# Patient Record
Sex: Male | Born: 1970 | Race: Black or African American | Hispanic: No | Marital: Married | Smoking: Never smoker
Health system: Southern US, Community
[De-identification: ages and names within clinical notes are randomized; demographics above are authoritative.]

## PROBLEM LIST (undated history)

## (undated) DIAGNOSIS — I10 Essential (primary) hypertension: Secondary | ICD-10-CM

## (undated) HISTORY — PX: KNEE SURGERY: SHX244

---

## 2002-11-12 ENCOUNTER — Emergency Department (HOSPITAL_COMMUNITY): Admission: EM | Admit: 2002-11-12 | Discharge: 2002-11-12 | Payer: Self-pay | Admitting: Emergency Medicine

## 2002-12-17 ENCOUNTER — Emergency Department (HOSPITAL_COMMUNITY): Admission: EM | Admit: 2002-12-17 | Discharge: 2002-12-18 | Payer: Self-pay | Admitting: Emergency Medicine

## 2004-04-16 ENCOUNTER — Emergency Department (HOSPITAL_COMMUNITY): Admission: EM | Admit: 2004-04-16 | Discharge: 2004-04-16 | Payer: Self-pay | Admitting: Emergency Medicine

## 2005-06-20 ENCOUNTER — Emergency Department (HOSPITAL_COMMUNITY): Admission: EM | Admit: 2005-06-20 | Discharge: 2005-06-20 | Payer: Self-pay | Admitting: Emergency Medicine

## 2005-08-06 ENCOUNTER — Emergency Department (HOSPITAL_COMMUNITY): Admission: EM | Admit: 2005-08-06 | Discharge: 2005-08-06 | Payer: Self-pay | Admitting: Emergency Medicine

## 2009-07-06 ENCOUNTER — Emergency Department (HOSPITAL_COMMUNITY): Admission: EM | Admit: 2009-07-06 | Discharge: 2009-07-06 | Payer: Self-pay | Admitting: Emergency Medicine

## 2011-01-01 ENCOUNTER — Emergency Department (HOSPITAL_COMMUNITY)
Admission: EM | Admit: 2011-01-01 | Discharge: 2011-01-01 | Payer: Self-pay | Source: Home / Self Care | Admitting: Emergency Medicine

## 2011-03-24 LAB — BASIC METABOLIC PANEL
BUN: 17 mg/dL (ref 6–23)
Chloride: 106 mEq/L (ref 96–112)
Creatinine, Ser: 1.34 mg/dL (ref 0.4–1.5)
GFR calc Af Amer: >60 mL/min (ref >60)
Glucose, Bld: 103 mg/dL — ABNORMAL HIGH (ref 70–99)
Potassium: 4.1 mEq/L (ref 3.5–5.1)

## 2011-03-24 LAB — DIFFERENTIAL
Basophils Absolute: 0.1 10*3/uL (ref 0.0–0.1)
Eosinophils Absolute: 0.2 10*3/uL (ref 0.0–0.7)
Eosinophils Relative: 2 % (ref 0–5)
Lymphocytes Relative: 27 % (ref 12–46)
Monocytes Absolute: 0.6 10*3/uL (ref 0.1–1.0)
Monocytes Relative: 8 % (ref 3–12)
Neutrophils Relative %: 62 % (ref 43–77)

## 2011-03-24 LAB — CBC
HCT: 43.5 % (ref 39.0–52.0)
Hemoglobin: 15.1 g/dL (ref 13.0–17.0)
MCV: 89.2 fL (ref 78.0–100.0)
Platelets: 257 10*3/uL (ref 150–400)
RDW: 12.6 % (ref 11.5–15.5)

## 2011-03-24 LAB — POCT CARDIAC MARKERS: Myoglobin, poc: 48.2 ng/mL (ref 12–200)

## 2014-04-28 ENCOUNTER — Encounter (HOSPITAL_COMMUNITY): Payer: Self-pay | Admitting: Emergency Medicine

## 2014-04-28 ENCOUNTER — Emergency Department (INDEPENDENT_AMBULATORY_CARE_PROVIDER_SITE_OTHER)
Admission: EM | Admit: 2014-04-28 | Discharge: 2014-04-28 | Disposition: A | Discharge status: Home / Self Care | Payer: BC Managed Care – PPO | Source: Home / Self Care | Attending: Family Medicine | Admitting: Family Medicine

## 2014-04-28 DIAGNOSIS — I10 Essential (primary) hypertension: Secondary | ICD-10-CM

## 2014-04-28 LAB — POCT I-STAT, CHEM 8
BUN: 13 mg/dL (ref 6–23)
CALCIUM ION: 1.22 mmol/L (ref 1.12–1.23)
CREATININE: 1.4 mg/dL — AB (ref 0.50–1.35)
Chloride: 102 mEq/L (ref 96–112)
Glucose, Bld: 101 mg/dL — ABNORMAL HIGH (ref 70–99)
HEMATOCRIT: 46 % (ref 39.0–52.0)
HEMOGLOBIN: 15.6 g/dL (ref 13.0–17.0)
Potassium: 4.1 mEq/L (ref 3.7–5.3)
SODIUM: 140 meq/L (ref 137–147)
TCO2: 28 mmol/L (ref 0–100)

## 2014-04-28 MED ORDER — LISINOPRIL 10 MG PO TABS: 10.0000 mg | ORAL_TABLET | Freq: Every day | ORAL | Status: DC

## 2014-04-28 MED ORDER — AMLODIPINE BESYLATE 10 MG PO TABS: 10.0000 mg | ORAL_TABLET | Freq: Every day | ORAL | Status: DC

## 2014-04-28 NOTE — ED Provider Notes (Signed)
CSN: [id number]     Arrival date & time 04/28/14  1600 History   First MD Initiated Contact with Patient 04/28/14 1630     Chief Complaint  Patient presents with  . Hypertension   (Consider location/radiation/quality/duration/timing/severity/associated sxs/prior Treatment) Patient is a 43 y.o. male presenting with hypertension. The history is provided by the patient.  Hypertension This is a new problem. The current episode started 3 to 5 hours ago (told by orthopedist today that bp 180/110 and [facility]. no prior h/o hbp., no smoking.). Pertinent negatives include no headaches and no shortness of breath.    History reviewed. No pertinent past medical history. History reviewed. No pertinent past surgical history. Family History  Problem Relation Age of Onset  . Hypertension Mother   . Hypertension Other    History  Substance Use Topics  . Smoking status: Never Smoker   . Smokeless tobacco: Not on file  . Alcohol Use: Yes    Review of Systems  Constitutional: Negative.   Respiratory: Negative for chest tightness and shortness of breath.   Cardiovascular: Negative for leg swelling.  Neurological: Negative for headaches.    Allergies  Review of patient's allergies indicates no known allergies.  Home Medications   Prior to Admission medications   Not on File   BP 173/105  Pulse 68  Temp(Src) 98.7 F (37.1 C) (Oral)  Resp 16  SpO2 99% Physical Exam  Nursing note and vitals reviewed. Constitutional: He is oriented to person, place, and time. He appears well-developed and well-nourished.  HENT:  Head: Normocephalic.  Right Ear: External ear normal.  Left Ear: External ear normal.  Mouth/Throat: Oropharynx is clear and moist.  Neck: Normal range of motion. Neck supple.  Cardiovascular: Regular rhythm and normal heart sounds.   Pulmonary/Chest: Effort normal and breath sounds normal.  Musculoskeletal: He exhibits no edema.  Lymphadenopathy:    He  has no cervical adenopathy.  Neurological: He is alert and oriented to person, place, and time.  Skin: Skin is warm and dry.    ED Course  Procedures (including critical care time) Labs Review Labs Reviewed  POCT I-STAT, CHEM 8 - Abnormal; Notable for the following:    Creatinine, Ser 1.40 (*)    Glucose, Bld 101 (*)    All other components within normal limits   i-stat wnl. Imaging Review No results found.   MDM   1. Hypertension        Linna HoffJames D Chiron Campione, MD 04/28/14 1742

## 2014-04-28 NOTE — Discharge Instructions (Signed)
Take medicine as prescribed, see your doctor for recheck of meds in 2-3 weeks.

## 2014-04-28 NOTE — ED Notes (Signed)
Reports no known personal history, but mother and MGM both had BP issues

## 2015-05-10 ENCOUNTER — Encounter (HOSPITAL_COMMUNITY): Payer: Self-pay | Admitting: Intensive Care

## 2015-05-10 ENCOUNTER — Emergency Department (HOSPITAL_COMMUNITY)
Admission: EM | Admit: 2015-05-10 | Discharge: 2015-05-10 | Disposition: A | Discharge status: Home / Self Care | Payer: PRIVATE HEALTH INSURANCE | Attending: Emergency Medicine | Admitting: Emergency Medicine

## 2015-05-10 ENCOUNTER — Emergency Department (HOSPITAL_COMMUNITY): Payer: PRIVATE HEALTH INSURANCE

## 2015-05-10 DIAGNOSIS — R519 Headache, unspecified: Secondary | ICD-10-CM

## 2015-05-10 DIAGNOSIS — N289 Disorder of kidney and ureter, unspecified: Secondary | ICD-10-CM | POA: Diagnosis not present

## 2015-05-10 DIAGNOSIS — I10 Essential (primary) hypertension: Secondary | ICD-10-CM | POA: Diagnosis not present

## 2015-05-10 DIAGNOSIS — M542 Cervicalgia: Secondary | ICD-10-CM | POA: Insufficient documentation

## 2015-05-10 DIAGNOSIS — R51 Headache: Secondary | ICD-10-CM | POA: Diagnosis present

## 2015-05-10 LAB — BASIC METABOLIC PANEL
ANION GAP: 6 (ref 5–15)
BUN: 17 mg/dL (ref 6–20)
CALCIUM: 8.9 mg/dL (ref 8.9–10.3)
CO2: 28 mmol/L (ref 22–32)
Chloride: 102 mmol/L (ref 101–111)
Creatinine, Ser: 1.51 mg/dL — ABNORMAL HIGH (ref 0.61–1.24)
GFR calc non Af Amer: 54 mL/min — ABNORMAL LOW (ref >60)
Glucose, Bld: 109 mg/dL — ABNORMAL HIGH (ref 65–99)
Potassium: 4.1 mmol/L (ref 3.5–5.1)
Sodium: 136 mmol/L (ref 135–145)

## 2015-05-10 LAB — CBG MONITORING, ED: Glucose-Capillary: 105 mg/dL — ABNORMAL HIGH (ref 65–99)

## 2015-05-10 MED ORDER — PROMETHAZINE HCL 25 MG/ML IJ SOLN
25.0000 mg | Freq: Once | INTRAMUSCULAR | Status: AC
  Administered 2015-05-10: 25 mg via INTRAMUSCULAR
  Filled 2015-05-10: qty 1

## 2015-05-10 MED ORDER — AMLODIPINE BESYLATE 10 MG PO TABS: 10.0000 mg | ORAL_TABLET | Freq: Every day | ORAL | Status: AC

## 2015-05-10 MED ORDER — LISINOPRIL 10 MG PO TABS: 10.0000 mg | ORAL_TABLET | Freq: Every day | ORAL | Status: AC

## 2015-05-10 NOTE — ED Provider Notes (Signed)
CSN: [id number]     Arrival date & time 05/10/15  1022 History  This chart was scribed for No att. providers found by Elveria Rising, ED scribe.  This patient was seen in room APA03/AP[facility]03 and the patient's care was started at 10:59 AM.   Chief Complaint  Patient presents with  . Headache   The history is provided by the patient. No language interpreter was used.   HPI Comments: George Hamilton is a 44 y.o. male who presents to the Emergency Department complaining of sudden onset frontal headache yesterday evening. Patient reports associated neck pain and episode blurred vision this morning. Patient denies numbness or weakness, chest pain, difficulty breathing or urinary symptoms. Patient started on amlodipine and lisinopril medications one year ago after elevated BP measured at Urgent Care visit. Patient states he is not currently taking any medications.    History reviewed. No pertinent past medical history. Past Surgical History  Procedure Laterality Date  . Knee surgery Right    Family History  Problem Relation Age of Onset  . Hypertension Mother   . Hypertension Other    History  Substance Use Topics  . Smoking status: Never Smoker   . Smokeless tobacco: Never Used  . Alcohol Use: Yes    Review of Systems  Constitutional: Negative for fever and chills.  HENT: Negative for congestion, rhinorrhea and sore throat.   Eyes: Negative for visual disturbance.  Respiratory: Negative for cough, shortness of breath and wheezing.   Cardiovascular: Negative for chest pain.  Gastrointestinal: Negative for nausea, vomiting, abdominal pain and diarrhea.  Endocrine: Negative for polyuria.  Genitourinary: Negative for dysuria, urgency, frequency and hematuria.  Musculoskeletal: Positive for neck pain. Negative for back pain.  Skin: Negative for rash.  Allergic/Immunologic: Negative for immunocompromised state.  Neurological: Positive for dizziness and headaches. Negative for weakness and  numbness.  Hematological: Does not bruise/bleed easily.  Psychiatric/Behavioral: Negative for confusion.      Allergies  Review of patient's allergies indicates no known allergies.  Home Medications   Prior to Admission medications   Medication Sig Start Date End Date Taking? Authorizing Provider  amLODipine (NORVASC) 10 MG tablet Take 1 tablet (10 mg total) by mouth daily. 05/10/15   George Core, MD  lisinopril (PRINIVIL,ZESTRIL) 10 MG tablet Take 1 tablet (10 mg total) by mouth daily. 05/10/15   George Core, MD   Triage Vitals: BP 172/135 mmHg  Pulse 77  Temp(Src) 98.3 F (36.8 C) (Oral)  Resp 18  Ht 6' (1.829 m)  Wt 230 lb (104.327 kg)  BMI 31.19 kg/m2  SpO2 100% Physical Exam  Constitutional: He is oriented to person, place, and time. He appears well-developed and well-nourished. No distress.  HENT:  Head: Normocephalic and atraumatic.  Head nontender.   Eyes: EOM are normal.  Neck: Neck supple. No JVD present. No tracheal deviation present.  No meningeal signs.   Cardiovascular: Normal rate.   Pulmonary/Chest: Effort normal and breath sounds normal. No respiratory distress. He has no wheezes. He has no rales.  Abdominal: Soft. There is no tenderness.  Musculoskeletal: Normal range of motion.  No peripheral edema.   Neurological: He is alert and oriented to person, place, and time.  Skin: Skin is warm and dry.  Psychiatric: He has a normal mood and affect. His behavior is normal.  Nursing note and vitals reviewed.   ED Course  Procedures (including critical care time)  COORDINATION OF CARE: 11:04 AM- Discussed treatment plan with patient at bedside and patient  agreed to plan.    Labs Review Labs Reviewed  BASIC METABOLIC PANEL - Abnormal; Notable for the following:    Glucose, Bld 109 (*)    Creatinine, Ser 1.51 (*)    GFR calc non Af Amer 54 (*)    All other components within normal limits  CBG MONITORING, ED - Abnormal; Notable for the  following:    Glucose-Capillary 105 (*)    All other components within normal limits    Imaging Review Ct Head Wo Contrast  05/10/2015   CLINICAL DATA:  Sudden onset frontal headache and neck pain with dizziness and blurred vision since yesterday.  EXAM: CT HEAD WITHOUT CONTRAST  TECHNIQUE: Contiguous axial images were obtained from the base of the skull through the vertex without intravenous contrast.  COMPARISON:  None.  FINDINGS: No evidence of an acute infarct, acute hemorrhage, mass lesion, mass effect or hydrocephalus. Scattered opacification of ethmoid air cells. Mastoid air cells are clear.  IMPRESSION: 1. Normal brain. 2. Scattered opacification of ethmoid air cells. No air-fluid levels.   Electronically Signed   By: Leanna BattlesMelinda  Blietz M.D.   On: 05/10/2015 11:52     EKG Interpretation None      MDM   Final diagnoses:  Essential hypertension  Nonintractable headache, unspecified chronicity pattern, unspecified headache type  Renal insufficiency    Patient with hypertension and headache. Feels somewhat better after treatment. Has been off his antihypertensives. Creatinine is elevated but only slightly compared to what it was on the most recent measurement. Will start back on blood pressure medicines and he will need to follow-up with PCP. I personally performed the services described in this documentation, which was scribed in my presence. The recorded information has been reviewed and is accurate.     George CoreNathan Caspar Favila, MD 05/10/15 [phone]

## 2015-05-10 NOTE — ED Notes (Addendum)
Equal grips noted. Facial symmetry noted. Speech clear. Pt denies any light/sound sensitivity.

## 2015-05-10 NOTE — ED Notes (Signed)
Pt states having neck pain and headache that started last night around 6:00pm and still currently experiencing. Pt reports dizziness and blurry vision.

## 2015-05-10 NOTE — Discharge Instructions (Signed)

## 2015-06-30 ENCOUNTER — Encounter (HOSPITAL_COMMUNITY): Payer: Self-pay | Admitting: *Deleted

## 2015-06-30 ENCOUNTER — Emergency Department (HOSPITAL_COMMUNITY): Payer: PRIVATE HEALTH INSURANCE

## 2015-06-30 ENCOUNTER — Emergency Department (HOSPITAL_COMMUNITY)
Admission: EM | Admit: 2015-06-30 | Discharge: 2015-06-30 | Disposition: A | Discharge status: Home / Self Care | Payer: PRIVATE HEALTH INSURANCE | Attending: Emergency Medicine | Admitting: Emergency Medicine

## 2015-06-30 DIAGNOSIS — Y9389 Activity, other specified: Secondary | ICD-10-CM | POA: Diagnosis not present

## 2015-06-30 DIAGNOSIS — X58XXXA Exposure to other specified factors, initial encounter: Secondary | ICD-10-CM | POA: Diagnosis not present

## 2015-06-30 DIAGNOSIS — S3992XA Unspecified injury of lower back, initial encounter: Secondary | ICD-10-CM | POA: Diagnosis present

## 2015-06-30 DIAGNOSIS — I10 Essential (primary) hypertension: Secondary | ICD-10-CM | POA: Diagnosis not present

## 2015-06-30 DIAGNOSIS — M5136 Other intervertebral disc degeneration, lumbar region: Secondary | ICD-10-CM | POA: Diagnosis not present

## 2015-06-30 DIAGNOSIS — Y9289 Other specified places as the place of occurrence of the external cause: Secondary | ICD-10-CM | POA: Insufficient documentation

## 2015-06-30 DIAGNOSIS — Y998 Other external cause status: Secondary | ICD-10-CM | POA: Insufficient documentation

## 2015-06-30 DIAGNOSIS — S39012A Strain of muscle, fascia and tendon of lower back, initial encounter: Secondary | ICD-10-CM | POA: Diagnosis not present

## 2015-06-30 HISTORY — DX: Essential (primary) hypertension: I10

## 2015-06-30 MED ORDER — DIAZEPAM 5 MG PO TABS
10.0000 mg | ORAL_TABLET | Freq: Once | ORAL | Status: AC
  Administered 2015-06-30: 10 mg via ORAL
  Filled 2015-06-30: qty 2

## 2015-06-30 MED ORDER — HYDROCODONE-ACETAMINOPHEN 5-325 MG PO TABS
2.0000 | ORAL_TABLET | Freq: Once | ORAL | Status: AC
  Administered 2015-06-30: 2 via ORAL
  Filled 2015-06-30: qty 2

## 2015-06-30 MED ORDER — METHOCARBAMOL 500 MG PO TABS: 1000.0000 mg | ORAL_TABLET | Freq: Three times a day (TID) | ORAL | Status: DC

## 2015-06-30 MED ORDER — IBUPROFEN 600 MG PO TABS: 600.0000 mg | ORAL_TABLET | Freq: Four times a day (QID) | ORAL | Status: DC

## 2015-06-30 NOTE — ED Notes (Signed)
Lower back pain after loading lawn mower yesterday.

## 2015-06-30 NOTE — ED Provider Notes (Signed)
CSN: [id number]     Arrival date & time 06/30/15  1401 History   First MD Initiated Contact with Patient 06/30/15 1448     Chief Complaint  Patient presents with  . Back Pain     (Consider location/radiation/quality/duration/timing/severity/associated sxs/prior Treatment) Patient is a 44 y.o. male presenting with back pain. The history is provided by the patient.  Back Pain Location:  Lumbar spine Quality:  Cramping, shooting and aching Pain severity:  Moderate Pain is:  Same all the time Onset quality:  Sudden Duration:  1 day Timing:  Intermittent Progression:  Worsening Chronicity:  New Context: lifting heavy objects   Relieved by:  Nothing Worsened by:  Nothing tried Ineffective treatments:  None tried Associated symptoms: no abdominal pain, no bladder incontinence, no bowel incontinence, no chest pain, no dysuria, no numbness, no perianal numbness and no weakness   Risk factors: no recent surgery and no vascular disease     Past Medical History  Diagnosis Date  . Hypertension    Past Surgical History  Procedure Laterality Date  . Knee surgery Right    Family History  Problem Relation Age of Onset  . Hypertension Mother   . Hypertension Other    History  Substance Use Topics  . Smoking status: Never Smoker   . Smokeless tobacco: Never Used  . Alcohol Use: Yes    Review of Systems  Constitutional: Negative for activity change.       All ROS Neg except as noted in HPI  HENT: Negative for nosebleeds.   Eyes: Negative for photophobia and discharge.  Respiratory: Negative for cough, shortness of breath and wheezing.   Cardiovascular: Negative for chest pain and palpitations.  Gastrointestinal: Negative for abdominal pain, blood in stool and bowel incontinence.  Genitourinary: Negative for bladder incontinence, dysuria, frequency and hematuria.  Musculoskeletal: Positive for back pain. Negative for arthralgias and neck pain.  Skin: Negative.   Neurological:  Negative for dizziness, seizures, speech difficulty, weakness and numbness.  Psychiatric/Behavioral: Negative for hallucinations and confusion.      Allergies  Review of patient's allergies indicates no known allergies.  Home Medications   Prior to Admission medications   Medication Sig Start Date End Date Taking? Authorizing Provider  amLODipine (NORVASC) 10 MG tablet Take 1 tablet (10 mg total) by mouth daily. 05/10/15   Benjiman Core, MD  lisinopril (PRINIVIL,ZESTRIL) 10 MG tablet Take 1 tablet (10 mg total) by mouth daily. 05/10/15   Benjiman Core, MD   BP 180/120 mmHg  Pulse 73  Temp(Src) 98.3 F (36.8 C) (Oral)  Resp 18  Ht  (1.803 m)  Wt 230 lb (104.327 kg)  BMI 32.09 kg/m2  SpO2 100% Physical Exam  Constitutional: He is oriented to person, place, and time. He appears well-developed and well-nourished.  Non-toxic appearance.  HENT:  Head: Normocephalic.  Right Ear: Tympanic membrane and external ear normal.  Left Ear: Tympanic membrane and external ear normal.  Eyes: EOM and lids are normal. Pupils are equal, round, and reactive to light.  Neck: Normal range of motion. Neck supple. Carotid bruit is not present.  Cardiovascular: Normal rate, regular rhythm, normal heart sounds, intact distal pulses and normal pulses.   Pulmonary/Chest: Breath sounds normal. No respiratory distress.  Abdominal: Soft. Bowel sounds are normal. There is no tenderness. There is no guarding.  Musculoskeletal:       Lumbar back: He exhibits decreased range of motion, tenderness, pain and spasm. He exhibits no deformity.  Lymphadenopathy:  Head (right side): No submandibular adenopathy present.       Head (left side): No submandibular adenopathy present.    He has no cervical adenopathy.  Neurological: He is alert and oriented to person, place, and time. He has normal strength. No cranial nerve deficit or sensory deficit.  Skin: Skin is warm and dry.  Psychiatric: He has a  normal mood and affect. His speech is normal.  Nursing note and vitals reviewed.   ED Course  Procedures (including critical care time) Labs Review Labs Reviewed - No data to display  Imaging Review No results found.   EKG Interpretation None      MDM  X-ray of the lumbar spine reveals mild disc space narrowing at the L3-L4, and L4-L5 hot areas. 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 No fracture seen. No gross neurologic deficit appreciated. Suspect lumbar strain, and aggravation of degenerative changes of the lumbar spine.  I discussed these findings with the patient in terms which he understands. I have strongly encouraged patient to discuss this with his primary physician for possible review on his next office visit. The patient is to return to the emergency department if any changes, problems, or concerns. Prescription for Robaxin and Motrin given to the patient.    Final diagnoses:  None    **I have reviewed nursing notes, vital signs, and all appropriate lab and imaging results for this patient.Ivery Quale*    Madisun Hargrove, PA-C 07/02/15 1240  Zadie Rhineonald Wickline, MD 07/02/15 [phone]

## 2015-06-30 NOTE — Discharge Instructions (Signed)
Your blood pressure is elevated at 180/120. Please make sure you take your medicine upon arrival home, and recheck your blood pressure over the weekend. If it remains elevated please discuss this with your primary physician. The x-ray of your lumbar spine shows arthritis changes, and some early degenerative disc disease changes. Please make your doctor aware of these changes so that it can be incorporated as part of your examination when you take your yearly physicals. Please rest your back this weekend. Please use Robaxin times daily, please use 600 mg of ibuprofen, and 500 mg of Tylenol 4 times daily. Heating pad will be helpful to your lower back. Please see your physician, or return to the emergency department if any emergent changes.

## 2015-08-26 ENCOUNTER — Emergency Department (HOSPITAL_COMMUNITY)
Admission: EM | Admit: 2015-08-26 | Discharge: 2015-08-26 | Disposition: A | Discharge status: Home / Self Care | Payer: PRIVATE HEALTH INSURANCE | Attending: Emergency Medicine | Admitting: Emergency Medicine

## 2015-08-26 ENCOUNTER — Encounter (HOSPITAL_COMMUNITY): Payer: Self-pay | Admitting: Emergency Medicine

## 2015-08-26 ENCOUNTER — Emergency Department (HOSPITAL_COMMUNITY): Payer: PRIVATE HEALTH INSURANCE

## 2015-08-26 DIAGNOSIS — M545 Low back pain, unspecified: Secondary | ICD-10-CM

## 2015-08-26 DIAGNOSIS — I10 Essential (primary) hypertension: Secondary | ICD-10-CM | POA: Diagnosis not present

## 2015-08-26 DIAGNOSIS — Z79899 Other long term (current) drug therapy: Secondary | ICD-10-CM | POA: Insufficient documentation

## 2015-08-26 DIAGNOSIS — R109 Unspecified abdominal pain: Secondary | ICD-10-CM | POA: Diagnosis present

## 2015-08-26 DIAGNOSIS — Z87442 Personal history of urinary calculi: Secondary | ICD-10-CM | POA: Diagnosis not present

## 2015-08-26 DIAGNOSIS — R10A0 Flank pain, unspecified side: Secondary | ICD-10-CM

## 2015-08-26 LAB — COMPREHENSIVE METABOLIC PANEL
ALK PHOS: 96 U/L (ref 38–126)
ALT: 20 U/L (ref 17–63)
ANION GAP: 8 (ref 5–15)
AST: 17 U/L (ref 15–41)
Albumin: 4.4 g/dL (ref 3.5–5.0)
BILIRUBIN TOTAL: 0.2 mg/dL — AB (ref 0.3–1.2)
BUN: 20 mg/dL (ref 6–20)
CALCIUM: 9.3 mg/dL (ref 8.9–10.3)
CO2: 26 mmol/L (ref 22–32)
CREATININE: 1.47 mg/dL — AB (ref 0.61–1.24)
Chloride: 102 mmol/L (ref 101–111)
GFR calc non Af Amer: 56 mL/min — ABNORMAL LOW (ref >60)
GLUCOSE: 117 mg/dL — AB (ref 65–99)
Potassium: 3.4 mmol/L — ABNORMAL LOW (ref 3.5–5.1)
SODIUM: 136 mmol/L (ref 135–145)
TOTAL PROTEIN: 8 g/dL (ref 6.5–8.1)

## 2015-08-26 LAB — URINALYSIS, ROUTINE W REFLEX MICROSCOPIC
BILIRUBIN URINE: NEGATIVE
GLUCOSE, UA: NEGATIVE mg/dL
Ketones, ur: NEGATIVE mg/dL
Leukocytes, UA: NEGATIVE
Nitrite: NEGATIVE
PH: 6 (ref 5.0–8.0)
Protein, ur: NEGATIVE mg/dL
Urobilinogen, UA: 0.2 mg/dL (ref 0.0–1.0)

## 2015-08-26 LAB — LIPASE, BLOOD: LIPASE: 24 U/L (ref 22–51)

## 2015-08-26 LAB — URINE MICROSCOPIC-ADD ON

## 2015-08-26 LAB — CBC WITH DIFFERENTIAL/PLATELET
BASOS ABS: 0 10*3/uL (ref 0.0–0.1)
BASOS PCT: 0 % (ref 0–1)
EOS PCT: 2 % (ref 0–5)
Eosinophils Absolute: 0.1 10*3/uL (ref 0.0–0.7)
HEMATOCRIT: 46.2 % (ref 39.0–52.0)
Hemoglobin: 16.4 g/dL (ref 13.0–17.0)
Lymphocytes Relative: 28 % (ref 12–46)
Lymphs Abs: 2.7 10*3/uL (ref 0.7–4.0)
MCH: 30.8 pg (ref 26.0–34.0)
MCHC: 35.5 g/dL (ref 30.0–36.0)
MCV: 86.8 fL (ref 78.0–100.0)
MONO ABS: 0.7 10*3/uL (ref 0.1–1.0)
MONOS PCT: 7 % (ref 3–12)
NEUTROS ABS: 6.1 10*3/uL (ref 1.7–7.7)
Neutrophils Relative %: 63 % (ref 43–77)
PLATELETS: 343 10*3/uL (ref 150–400)
RBC: 5.32 MIL/uL (ref 4.22–5.81)
RDW: 12.1 % (ref 11.5–15.5)
WBC: 9.6 10*3/uL (ref 4.0–10.5)

## 2015-08-26 MED ORDER — HYDROCODONE-ACETAMINOPHEN 5-325 MG PO TABS
1.0000 | ORAL_TABLET | Freq: Once | ORAL | Status: AC
  Administered 2015-08-26: 1 via ORAL
  Filled 2015-08-26: qty 1

## 2015-08-26 MED ORDER — SODIUM CHLORIDE 0.9 % IV BOLUS (SEPSIS)
500.0000 mL | Freq: Once | INTRAVENOUS | Status: AC
  Administered 2015-08-26: 500 mL via INTRAVENOUS

## 2015-08-26 MED ORDER — NAPROXEN 500 MG PO TABS: 500.0000 mg | ORAL_TABLET | Freq: Two times a day (BID) | ORAL | Status: DC

## 2015-08-26 MED ORDER — HYDROCODONE-ACETAMINOPHEN 5-325 MG PO TABS: 1.0000 | ORAL_TABLET | Freq: Four times a day (QID) | ORAL | Status: DC | PRN

## 2015-08-26 MED ORDER — FENTANYL CITRATE (PF) 100 MCG/2ML IJ SOLN
50.0000 ug | Freq: Once | INTRAMUSCULAR | Status: AC
  Administered 2015-08-26: 50 ug via INTRAVENOUS
  Filled 2015-08-26: qty 2

## 2015-08-26 MED ORDER — CYCLOBENZAPRINE HCL 10 MG PO TABS: 10.0000 mg | ORAL_TABLET | Freq: Two times a day (BID) | ORAL | Status: AC | PRN

## 2015-08-26 MED ORDER — ONDANSETRON HCL 4 MG/2ML IJ SOLN
4.0000 mg | Freq: Once | INTRAMUSCULAR | Status: AC
  Administered 2015-08-26: 4 mg via INTRAVENOUS
  Filled 2015-08-26: qty 2

## 2015-08-26 MED ORDER — SODIUM CHLORIDE 0.9 % IV SOLN
INTRAVENOUS | Status: DC
  Administered 2015-08-26: 21:00:00 via INTRAVENOUS

## 2015-08-26 NOTE — ED Provider Notes (Addendum)
CSN: [id number]     Arrival date & time 08/26/15  1854 History   First MD Initiated Contact with Patient 08/26/15 1902     Chief Complaint  Patient presents with  . Flank Pain     (Consider location/radiation/quality/duration/timing/severity/associated sxs/prior Treatment) Patient is a 44 y.o. male presenting with flank pain. The history is provided by the patient and the spouse.  Flank Pain Pertinent negatives include no chest pain, no abdominal pain, no headaches and no shortness of breath.   patient awoke Friday morning with right flank pain and right lower back pain. This pain has persisted and made worse by trying to sit and stand. Pain is a currently 8 out of 10. Described as a sharp ache.  Not associated with any nausea or vomiting. No dysuria no hematuria. No numbness or weakness to the legs or pain radiating into the legs. Patient does have a history of kidney stones in the past about 10 years ago.  Past Medical History  Diagnosis Date  . Hypertension    Past Surgical History  Procedure Laterality Date  . Knee surgery Right    Family History  Problem Relation Age of Onset  . Hypertension Mother   . Hypertension Other    Social History  Substance Use Topics  . Smoking status: Never Smoker   . Smokeless tobacco: Never Used  . Alcohol Use: Yes    Review of Systems  Constitutional: Negative for fever.  HENT: Negative for congestion.   Eyes: Negative for visual disturbance.  Respiratory: Negative for shortness of breath.   Cardiovascular: Negative for chest pain.  Gastrointestinal: Negative for nausea, vomiting and abdominal pain.  Genitourinary: Positive for flank pain. Negative for dysuria and hematuria.  Musculoskeletal: Positive for back pain.  Skin: Negative for rash.  Neurological: Negative for headaches.  Hematological: Does not bruise/bleed easily.  Psychiatric/Behavioral: Negative for confusion.      Allergies  Review of patient's allergies  indicates no known allergies.  Home Medications   Prior to Admission medications   Medication Sig Start Date End Date Taking? Authorizing Provider  amLODipine (NORVASC) 10 MG tablet Take 1 tablet (10 mg total) by mouth daily. 05/10/15  Yes Benjiman Core, MD  ibuprofen (ADVIL,MOTRIN) 200 MG tablet Take 400 mg by mouth every 6 (six) hours as needed for mild pain.   Yes Historical Provider, MD  lisinopril (PRINIVIL,ZESTRIL) 10 MG tablet Take 1 tablet (10 mg total) by mouth daily. 05/10/15  Yes Benjiman Core, MD  cyclobenzaprine (FLEXERIL) 10 MG tablet Take 1 tablet (10 mg total) by mouth 2 (two) times daily as needed for muscle spasms. 08/26/15   Vanetta Mulders, MD  HYDROcodone-acetaminophen (NORCO/VICODIN) 5-325 MG per tablet Take 1-2 tablets by mouth every 6 (six) hours as needed. 08/26/15   Vanetta Mulders, MD  methocarbamol (ROBAXIN) 500 MG tablet Take 2 tablets (1,000 mg total) by mouth 3 (three) times daily. 06/30/15   Ivery Quale, PA-C  naproxen (NAPROSYN) 500 MG tablet Take 1 tablet (500 mg total) by mouth 2 (two) times daily. 08/26/15   Vanetta Mulders, MD   BP 191/103 mmHg  Pulse 96  Temp(Src) 98.4 F (36.9 C) (Oral)  Resp 18  Ht 5\' 11"  (1.803 m)  Wt 230 lb (104.327 kg)  BMI 32.09 kg/m2  SpO2 100% Physical Exam  Constitutional: He is oriented to person, place, and time. He appears well-developed and well-nourished. No distress.  HENT:  Head: Normocephalic and atraumatic.  Mouth/Throat: Oropharynx is clear and moist.  Eyes: Conjunctivae  and EOM are normal. Pupils are equal, round, and reactive to light.  Neck: Normal range of motion. Neck supple.  Cardiovascular: Normal rate, regular rhythm and normal heart sounds.   No murmur heard. Pulmonary/Chest: Effort normal and breath sounds normal. No respiratory distress.  Abdominal: Soft. Bowel sounds are normal. There is no tenderness.  Musculoskeletal: Normal range of motion. He exhibits tenderness.  Tender to palpation right  lower back area.  Neurological: He is alert and oriented to person, place, and time. No cranial nerve deficit. He exhibits normal muscle tone. Coordination normal.  Skin: Skin is warm. No rash noted.  Nursing note and vitals reviewed.   ED Course  Procedures (including critical care time) Labs Review Labs Reviewed  URINALYSIS, ROUTINE W REFLEX MICROSCOPIC (NOT AT [facility]) - Abnormal; Notable for the following:    APPearance HAZY (*)    Specific Gravity, Urine >1.030 (*)    Hgb urine dipstick TRACE (*)    All other components within normal limits  COMPREHENSIVE METABOLIC PANEL - Abnormal; Notable for the following:    Potassium 3.4 (*)    Glucose, Bld 117 (*)    Creatinine, Ser 1.47 (*)    Total Bilirubin 0.2 (*)    GFR calc non Af Amer 56 (*)    All other components within normal limits  URINE MICROSCOPIC-ADD ON - Abnormal; Notable for the following:    Crystals CA OXALATE CRYSTALS (*)    All other components within normal limits  LIPASE, BLOOD  CBC WITH DIFFERENTIAL/PLATELET    Imaging Review Ct Renal Stone Study  08/26/2015   CLINICAL DATA:  44 year old male with lower back pain and right flank pain.  EXAM: CT ABDOMEN AND PELVIS WITHOUT CONTRAST  TECHNIQUE: Multidetector CT imaging of the abdomen and pelvis was performed following the standard protocol without IV contrast.  COMPARISON:  Radiograph dated 06/30/2015  FINDINGS: Evaluation of this exam is limited in the absence of intravenous contrast.  The visualized lung bases are clear. No intra-abdominal free air or free fluid.  There is a 1.3 x 1.1 cm hypodensity in the distal body of the pancreas is not well characterized but may represent a mucous retention cyst or side branch IPMN. Other etiologies are not excluded. MRI is recommended for further characterization. There is no pancreatic atrophy or ductal dilatation.  The liver, gallbladder, spleen, adrenal glands, kidneys, visualized ureters and urinary bladder appear unremarkable.  A subcentimeter right renal inferior pole exophytic hypodense lesion is not well characterized but likely represents a cyst. Ultrasound may provide better evaluation.  The prostate and seminal vesicles are grossly unremarkable.  There scattered sigmoid diverticula without active inflammation. No evidence of bowel obstruction or inflammation. Multiple normal caliber fecalized loops of small bowel noted suggestive of chronic stasis. Normal appendix.  The aorta and IVC appear unremarkable. There is no lymphadenopathy. There is degenerative changes of the spine and right SI joint. No acute fracture. 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  IMPRESSION: No hydronephrosis or nephrolithiasis.  Small distal pancreatic body hypodense lesions. MRI is recommended for further characterization.  No evidence of bowel obstruction or inflammation.  Normal appendix.   Electronically Signed   By: Elgie Collard M.D.   On: 08/26/2015 21:53   I have personally reviewed and evaluated these images and lab results as part of my medical decision-making.   EKG Interpretation None      MDM   Final diagnoses:  Flank pain  Right-sided low back pain without sciatica  Essential hypertension  Patient with history of kidney stones about 10 years ago. However this right flank pain and right low back pain is made worse with movement which is unlikely for kidney stones.  Patient will be worked up with CT renal stone study.    Vanetta Mulders, MD 08/26/15 1951  Patients CT scan without evidence of any kidney stones. Does raise concern for possible early mass in the pancreas MRI will need to be done. Patient is aware of the importance of that getting done. Patient will be treated for muscular skeletal back pain. Work note provided. In addition patient currently does not have a primary care provider so referral information provided so that he can follow-up to get the MRI done and so that his high blood pressure can be better  controlled. He is starting to show some signs of renal insufficiency.  Vanetta Mulders, MD 08/26/15 2237

## 2015-08-26 NOTE — Discharge Instructions (Signed)
Workup for the back pain and flank pain seems to be muscular in nature. Take the Flexeril which is a muscle relaxer in the Naprosyn on a regular basis. Supplement with the hydrocodone as needed for more severe pain. Recommend off your feet at rest for the next couple days. Work note provided. In addition important to find a regular doctor to help manage her high blood pressure. Kidneys are starting to show some evidence of stress from the high blood pressure over the years. In addition CT scan that was done showed questionable mass in the pancreas and MRI of the pancreas is recommended. This would be important to have evaluated.  In addition resource guide provided to help you find a regular doctor if the referral does not work out. Also could try the free clinic in [place].   Emergency Department Resource Guide 1) Find a Doctor and Pay Out of Pocket Although you won't have to find out who is covered by your insurance plan, it is a good idea to ask around and get recommendations. You will then need to call the office and see if the doctor you have chosen will accept you as a new patient and what types of options they offer for patients who are self-pay. Some doctors offer discounts or will set up payment plans for their patients who do not have insurance, but you will need to ask so you aren't surprised when you get to your appointment.  2) Contact Your Local Health Department Not all health departments have doctors that can see patients for sick visits, but many do, so it is worth a call to see if yours does. If you don't know where your local health department is, you can check in your phone book. The CDC also has a tool to help you locate your state's health department, and many state websites also have listings of all of their local health departments.  3) Find a Walk-in Clinic If your illness is not likely to be very severe or complicated, you may want to try a walk in clinic. These are popping  up all over the country in pharmacies, drugstores, and shopping centers. They're usually staffed by nurse practitioners or physician assistants that have been trained to treat common illnesses and complaints. They're usually fairly quick and inexpensive. However, if you have serious medical issues or chronic medical problems, these are probably not your best option.  No Primary Care Doctor: - Call Health Connect at  [phone] - they can help you locate a primary care doctor that  accepts your insurance, provides certain services, etc. - Physician Referral Service- [phone]  Chronic Pain Problems: Organization         Address  Phone   Notes  [facility]  [phone] Patients need to be referred by their primary care doctor.   Medication Assistance: Organization         Address  Phone   Notes  [facility] Medication [facility] [address] [place] [place], [place] [address] [phone] --Must be a resident of [facility] -- Must have NO insurance coverage whatsoever (no Medicaid/ Medicare, etc.) -- The pt. MUST have a primary care doctor that directs their care regularly and follows them in the community   [facility]ist  [phone]   [employer]  [phone]    Agencies that provide inexpensive medical care: Organization         Address  Phone   Notes  Patrcia Dolly  [facility] Family [facility]  [phone]   [facility]    [phone]   [facility] [address] [place], [place] [address] [phone]   [facility] [address] [place], [place] [phone]   Planned Parenthood    [phone]   [facility]    [phone]   [facility]  [address], [place] Phone:  [phone], Fax:  [phone] Hours of Operation:  9 am - 6 pm, M-F.  Also accepts Medicaid/Medicare and self-pay.  [facility]  [address], [place] Phone: [phone], Fax: [phone]. Hours of Operation:  8:30 am - 5:30 pm, M-F.  Also accepts Medicaid and self-pay.  [facility] [place] [address] [place] Phone: [phone]   [facility] [address] Natasha Bence [place], [place] [phone], Ext. [phone] Mondays & Thursdays: 7-9 AM.  First 15 patients are seen on a first come, first serve basis.    Medicaid-accepting [facility] Providers:  Organization         Address  Phone   Notes  [facility] [address], [place] [phone] Also accepts self-pay patients.  [facility] [address] Laurell Josephs [place], [place]  [phone]   [facility] [address], [place] [phone]   [facility] [address], [place] [phone]   Renaye Rakers [address], [place]   [phone] Only accepts [place] Access [place] patients after they have their name applied to their card.   Self-Pay (no insurance) in [facility]:  Organization         Address  Phone   Notes  Sickle Cell Patients, [facility] Internal Medicine [address] [place], [place] [phone]   [facility] Urgent Care [address] [place], [place] [phone]   [facility]  [address],  [phone]   [facility]/Dr. Osei-Bonsu  [address] [place] or [address], [place] [phone] Phone number for both [place] and [place] locations is the same.  Urgent Medical and [facility] [address] [place] [phone]    [facility] [address], [place] or [address] [phone]   [facility] [address] [place] [phone], phone; [phone], fax Sees patients 1st and 3rd Saturday of  every month.  Must not qualify for public or private insurance (i.e. Medicaid, Medicare, [place] Health Choice, Veterans' Benefits)  Household income should be no more than 200% of the poverty level The clinic cannot treat you if you are pregnant or think you are pregnant  Sexually transmitted diseases are not treated at the clinic.    Dental Care: Organization         Address  Phone  Notes  [facility] Department of [facility] [address] [place], [place] [phone] Accepts children up to age 16 who are enrolled in [place] or [place] Health Choice; pregnant women with a Medicaid card; and children who have applied for Medicaid or [place] Health Choice, but were declined, whose parents can pay a reduced fee at time of service.  [facility] Department of [employer]  [facility] [place]  [address], [place] [phone] Accepts children up to age 72 who are enrolled in Medicaid or [place] Health Choice; pregnant women with a Medicaid card; and children who have applied for Medicaid or [place] Health Choice, but were declined, whose parents can pay a reduced fee at time of service.  [facility]  [address] [place], [place] [phone] Patients are seen by appointment only. Walk-ins are not accepted. [facility] will see patients 65 years of age and older. Monday - Tuesday (8am-5pm) Most Wednesdays (8:30-5pm) $30 per visit, cash only  [facility]  [address], [facility] [phone] Patients are seen by appointment only. Walk-ins are not accepted. [facility] will see patients 45 years of age and older. One Wednesday Evening (Monthly: Volunteer Based).  $30 per visit, cash only  [employer]  [phone] for adults; Children under age 72, call Graduate Pediatric Dentistry at [phone]. Children aged 52-14, please call [phone] to request a pediatric application.  Dental  services are provided in all areas of dental care including fillings, crowns and bridges, complete and partial dentures, implants, gum treatment, root canals, and extractions. Preventive care is also provided. Treatment is provided to both adults and children. Patients are selected via a lottery and there is often a waiting list.   [facility] [address] [place]  [phone] [email or web address]   [facility] [address] [place], [place] [phone], Ext. [phone] Second and Fourth Thursday of each month, opens at 6:30 AM; Clinic ends at 9 AM.  Patients are seen on a first-come first-served basis, and a limited number are seen during each clinic.   [facility]  [address] Griff[address] [place], [place] [phone]   Eligibility Requirements You must have lived in [place], [place], or [place] counties for at least the last three months.   You cannot be eligible for state or federal sponsored [place], including [employer], [place], or [employer].   You generally cannot be eligible for healthcare insurance through your employer.    How to apply: Eligibility screenings are held every Tuesday and Wednesday afternoon from 1:00 pm until 4:00 pm. You do not need an appointment for the interview!  [facility] [address], [place], [place] [phone]   [facility] Health Department  [phone]   [facility] Health Department  [phone]   [facility] Health Department  [phone]    Behavioral Health Resources in the Community: Intensive Outpatient Programs Organization         Address  Phone  Notes  [facility] [address], [place], [place] [phone]   [facility] Outpatient [address] [place], [place] [phone]   [facility] [address] [place], [place]  [phone]   [facility] Mental Health [address]    [place], [place] [phone] or [phone]   Substance Abuse Resources Organization         Address  Phone  Notes  [facility]  [phone]   [facility]  [phone]   [facility]  [phone]   Floydene Flock  [phone]   Residential & Outpatient Substance Abuse Program  [phone]   Psychological Services Organization         Address  Phone  Notes  [facility] Behavioral Health  [phone]   [employer]   [facility] Mental Health [address] [place] [phone] or [phone]    Mobile Crisis Teams Organization         Address  Phone  Notes  [facility], Mobile Crisis Care Unit  [phone]   [facility]  [address] [place], [place] [phone]   Doristine Locks [address] [place] [phone]    Self-Help/Support Groups Organization         Address  Phone             Notes  [facility] [place] - variety of support groups  336- [phone] Call for more information  Narcotics Anonymous (NA), [facility] [address], [employer] [place]  2 meetings at this location   Statistician         Address  Phone  Notes  [facility] [address],    [place]  [phone]   [facility]  [address], [place] [address], [place], [place] [phone]   [facility] [address] [place], [place] [phone] Admissions: 8am-3pm M-F  [facility] [address],    [place], [place] [phone]   [facility] [address] [place], [place], [place] [phone]   [facility] [address]  [place], [place] [phone]   [facility] - Intensive Outpatient [address], [place], [place] [phone]   [facility] (Addiction [facility] [address] [place]  [place], [place]  [phone] or [phone]   [facility] ([facility]) [address] [place], [place] [phone] Accepts Medicaid  [facility] [address]  [place] [phone] Substance Abuse/Addiction Treatment   [facility] Organization         Address  Phone  Notes  [facility]  [phone]   Angie Fava, PhD [address] Ervin Knack [place], [place]   [phone] or [phone]   [facility] Behavioral   [address] [place], [place] [phone]   [facility] [address] [place], [place] [phone] Insurance/Medicaid/sponsorship through [facility] and Families [address]                                    [place], [place] [phone] Therapy/tele-psych/case  [facility] [address] [place], [place] [phone]    Dr. Lolly Mustache  [phone]   [facility]  [employer] [facility] Dept. 1) [address] [place] 2) [address] [place] 3)  [address] [place] [address], [place] [phone]  [phone]   [facility] Child Abuse Hotline [phone] or [phone] (After Hours)

## 2015-08-26 NOTE — ED Notes (Addendum)
Pian in lower back since Friday morning. Taken  ibuprofen around 3pm. With minimal relief.Pt also states the pain is radiating around to right flank. No pain on urination, hx of kidney stones. No known injury.

## 2018-04-22 ENCOUNTER — Emergency Department (HOSPITAL_COMMUNITY): Payer: BLUE CROSS/BLUE SHIELD

## 2018-04-22 ENCOUNTER — Encounter (HOSPITAL_COMMUNITY): Payer: Self-pay | Admitting: Emergency Medicine

## 2018-04-22 ENCOUNTER — Other Ambulatory Visit: Payer: Self-pay

## 2018-04-22 ENCOUNTER — Emergency Department (HOSPITAL_COMMUNITY)
Admission: EM | Admit: 2018-04-22 | Discharge: 2018-04-22 | Disposition: A | Discharge status: Home / Self Care | Payer: BLUE CROSS/BLUE SHIELD | Attending: Emergency Medicine | Admitting: Emergency Medicine

## 2018-04-22 DIAGNOSIS — Y999 Unspecified external cause status: Secondary | ICD-10-CM | POA: Insufficient documentation

## 2018-04-22 DIAGNOSIS — Y929 Unspecified place or not applicable: Secondary | ICD-10-CM | POA: Diagnosis not present

## 2018-04-22 DIAGNOSIS — S62024A Nondisplaced fracture of middle third of navicular [scaphoid] bone of right wrist, initial encounter for closed fracture: Secondary | ICD-10-CM | POA: Diagnosis not present

## 2018-04-22 DIAGNOSIS — Z79899 Other long term (current) drug therapy: Secondary | ICD-10-CM | POA: Diagnosis not present

## 2018-04-22 DIAGNOSIS — I10 Essential (primary) hypertension: Secondary | ICD-10-CM

## 2018-04-22 DIAGNOSIS — X58XXXA Exposure to other specified factors, initial encounter: Secondary | ICD-10-CM | POA: Insufficient documentation

## 2018-04-22 DIAGNOSIS — S6991XA Unspecified injury of right wrist, hand and finger(s), initial encounter: Secondary | ICD-10-CM | POA: Diagnosis present

## 2018-04-22 DIAGNOSIS — Y939 Activity, unspecified: Secondary | ICD-10-CM | POA: Diagnosis not present

## 2018-04-22 MED ORDER — HYDROCODONE-ACETAMINOPHEN 5-325 MG PO TABS
1.0000 | ORAL_TABLET | Freq: Once | ORAL | Status: AC
  Administered 2018-04-22: 1 via ORAL
  Filled 2018-04-22: qty 1

## 2018-04-22 MED ORDER — HYDROCODONE-ACETAMINOPHEN 5-325 MG PO TABS: 1.0000 | ORAL_TABLET | ORAL | 0 refills | Status: AC | PRN

## 2018-04-22 NOTE — Discharge Instructions (Addendum)
Wear the splint at all times to protect your wrist.  Call Dr. Romeo Apple for an appointment within the next several days for further evaluation and management of todays findings on your xray.  Also, make sure you pick you your blood pressure medicine.  Your diltiazem is ready for you now and the substitute for the benicar should be available soon (ask the pharmacist about this one).

## 2018-04-22 NOTE — ED Provider Notes (Signed)
[facility] EMERGENCY DEPARTMENT Provider Note   CSN: [id number] Arrival date & time: 04/22/18  1433     History   Chief Complaint Chief Complaint  Patient presents with  . Wrist Pain    HPI George Hamilton is a 47 y.o. male with a past medical history of (currently) untreated htn, presenting with pain in his right wrist which started 3 days ago, upon waking Monday morning.  He denies any new injury to his wrist, reports a distant history of scaphoid fracture when he was a teenager which was treated with full casting and had no problems with the hand until now.  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  He is right handed. He has had no treatment prior to arrival.  He has had tylenol this morning without pain relief.   He saw his pcp yesterday and prescribed 2 different blood pressure medicines, diltiazem and benicar, but has not filled yet, stating the benicar is on backorder.  He denies cp, sob, headache, vision changes.   The history is provided by the patient.    Past Medical History:  Diagnosis Date  . Hypertension     There are no active problems to display for this patient.   Past Surgical History:  Procedure Laterality Date  . KNEE SURGERY Right         Home Medications    Prior to Admission medications   Medication Sig Start Date End Date Taking? Authorizing Provider  amLODipine (NORVASC) 10 MG tablet Take 1 tablet (10 mg total) by mouth daily. 05/10/15   Benjiman Core, MD  cyclobenzaprine (FLEXERIL) 10 MG tablet Take 1 tablet (10 mg total) by mouth 2 (two) times daily as needed for muscle spasms. 08/26/15   Vanetta Mulders, MD  HYDROcodone-acetaminophen (NORCO/VICODIN) 5-325 MG tablet Take 1 tablet by mouth every 4 (four) hours as needed. 04/22/18   Burgess Amor, PA-C  lisinopril (PRINIVIL,ZESTRIL) 10 MG tablet Take 1 tablet (10 mg total) by mouth daily. 05/10/15    Benjiman Core, MD  methocarbamol (ROBAXIN) 500 MG tablet Take 2 tablets (1,000 mg total) by mouth 3 (three) times daily. 06/30/15   Ivery Quale, PA-C    Family History Family History  Problem Relation Age of Onset  . Hypertension Other   . Hypertension Mother     Social History Social History   Tobacco Use  . Smoking status: Never Smoker  . Smokeless tobacco: Never Used  Substance Use Topics  . Alcohol use: Yes    Comment: occasional  . Drug use: No     Allergies   Patient has no known allergies.   Review of Systems Review of Systems  Constitutional: Negative for fever.  Eyes: Negative for visual disturbance.  Respiratory: Negative for shortness of breath.   Cardiovascular: Negative for chest pain.  Musculoskeletal: Positive for arthralgias and joint swelling. Negative for myalgias.  Neurological: Negative for weakness, numbness and headaches.     Physical Exam Updated Vital Signs BP (!) 172/125 (BP Location: Left Arm)   Pulse 87   Temp 98.2 F (36.8 C) (Oral)   Resp 18   Ht  (1.803 m)   Wt 102.1 kg (225 lb)   SpO2 100%   BMI 31.38 kg/m   Physical Exam  Constitutional: He appears well-developed and well-nourished.  HENT:  Head: Atraumatic.  Neck: Normal range of motion.  Cardiovascular:  Pulses equal bilaterally  Musculoskeletal: He exhibits edema and tenderness.       Right wrist: He exhibits bony tenderness and swelling. He exhibits no crepitus and no deformity.  ttp with edema noted right radial wrist.  No palpable deformity.  Radial pulse full with less than 2 sec cap refill in fingertips.  Distal sensation intact. Forearm and elbow normal, nontender.  Neurological: He is alert. He has normal strength. He displays normal reflexes. No sensory deficit.  Skin: Skin is warm and dry.  Psychiatric: He has a normal mood and affect.     ED Treatments / Results  Labs (all labs ordered are listed, but only abnormal results are  displayed) Labs Reviewed - No data to display  EKG None  Radiology Dg Wrist Complete Right  Result Date: 04/22/2018 CLINICAL DATA:  Right wrist pain for 3 days EXAM: RIGHT WRIST - COMPLETE 3+ VIEW COMPARISON:  None. FINDINGS: Ununited fracture of waist of the right scaphoid with increased density of the scaphoid as can be seen with avascular necrosis. No other acute fracture or dislocation. Ulnar minus variance. Mild osteoarthritis of the first CMC joint. IMPRESSION: Ununited fracture of waist of the right scaphoid with increased density of the scaphoid as can be seen with avascular necrosis. Electronically Signed   By: Elige Ko   On: 04/22/2018 15:15    Procedures Procedures (including critical care time)  Medications Ordered in ED Medications - No data to display   Initial Impression / Assessment and Plan / ED Course  I have reviewed the triage vital signs and the nursing notes.  Pertinent labs & imaging results that were available during my care of the patient were reviewed by me and considered in my medical decision making (see chart for details).    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 No new acute injury.  He was placed in a radial gutter splint, prescribed pain medicine. Discussed xrays with patient. Discussed with Dr. Romeo Apple who will see pt in his office. Pt to call for appt.  Referral to Dr. Romeo Apple for follow up care. Pt understands to call for appt.    Also he received call while here, his diltiazem is ready for pick up. Pcp working on alternative prescription in place of benicar. Pt with asymptomatic htn. Encouraged to get diltiazem started today.   Final Clinical Impressions(s) / ED Diagnoses   Final diagnoses:  Closed nondisplaced fracture of middle third of scaphoid bone of right wrist, initial encounter  Essential hypertension    ED Discharge Orders        Ordered    HYDROcodone-acetaminophen  (NORCO/VICODIN) 5-325 MG tablet  Every 4 hours PRN     04/22/18 1629       Burgess Amor, PA-C 04/22/18 1718    Bethann Berkshire, MD 04/23/18 [phone]

## 2018-04-22 NOTE — ED Triage Notes (Signed)
Patient c/o right wrist pain with swelling. Per patient woke with pain. Denies any known injury. Patient took x2 tylenol this morning at 9:30am with no relief. Limited ROM per patient. Radial pulse present.

## 2018-04-22 NOTE — ED Notes (Signed)
Pt woke up with right wrist swelling and pain, pt denies hx of same, pt denies any injury to wrist as well.

## 2018-04-24 ENCOUNTER — Encounter: Payer: Self-pay | Admitting: Orthopedic Surgery

## 2018-04-24 ENCOUNTER — Ambulatory Visit (INDEPENDENT_AMBULATORY_CARE_PROVIDER_SITE_OTHER): Payer: BLUE CROSS/BLUE SHIELD | Admitting: Orthopedic Surgery

## 2018-04-24 VITALS — BP 173/116 | HR 70 | Ht 70.0 in | Wt 222.0 lb

## 2018-04-24 DIAGNOSIS — S62001S Unspecified fracture of navicular [scaphoid] bone of right wrist, sequela: Secondary | ICD-10-CM | POA: Diagnosis not present

## 2018-04-24 DIAGNOSIS — M19131 Post-traumatic osteoarthritis, right wrist: Secondary | ICD-10-CM | POA: Diagnosis not present

## 2018-04-24 NOTE — Patient Instructions (Signed)
OOW X 2 WEEKS 

## 2018-04-24 NOTE — Progress Notes (Signed)
  NEW PATIENT OFFICE VISIT   Chief Complaint  Patient presents with  . Wrist Pain    ER follw up on right wrist fracture, no injury. ER states old fracture.     MEDICAL DECISION SECTION  xrays ordered?  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  . 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    Chief Complaint  Patient presents with  . Wrist Pain    ER follw up on right wrist fracture, no injury. ER states old fracture.    47 year old male presents with painful swelling of the right wrist.  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  Up until that point he had had no problems with the wrist at all.  He does not have any previous loss of motion or strength until a couple of days ago.  His x-ray shows scaphoid advance collapse from a nonunion.  He was splinted in the ER and comes in for evaluation and treatment   Review of Systems  All other systems reviewed and are negative.    Past Medical History:  Diagnosis Date  . Hypertension     Past Surgical History:  Procedure Laterality Date  . KNEE SURGERY Right     Family History  Problem Relation Age of Onset  . Hypertension Other   . Hypertension Mother    Social History   Tobacco Use  . Smoking status: Never Smoker  . Smokeless tobacco: Never Used  Substance Use Topics  . Alcohol use: Yes    Comment: occasional  . Drug use: No    No known drug allergies  No outpatient medications have been marked as taking for the 04/24/18 encounter  (Office Visit) with Vickki Hearing, MD.    BP (!) 173/116   Pulse 70   Ht  (1.778 m)   Wt 222 lb (100.7 kg)   BMI 31.85 kg/m   Physical Exam  Constitutional: He is oriented to person, place, and time. He appears well-developed and well-nourished.  Vital signs have been reviewed and are stable. Gen. appearance the patient is well-developed and well-nourished with normal grooming and hygiene.   Neurological: He is alert and oriented to person, place, and time.  Skin: Skin is warm and dry. No erythema.  Psychiatric: He has a normal mood and affect.  Vitals reviewed.   Ortho Exam    Wrist examination left wrist normal range of motion stability strength alignment neurovascular exam is intact no skin lesions  Right hand tenderness and swelling over the wrist joint pain full range of motion in all planes decreased flexion extension painful flexion extension decreased grip vascular exam intact skin normal

## 2018-04-28 ENCOUNTER — Telehealth: Payer: Self-pay | Admitting: Radiology

## 2018-04-28 NOTE — Telephone Encounter (Signed)
Dr  Amanda Pea office indicates they have reached out to patient multiple times to make appt, but they keep getting "hung up" on. I called patient, gave him the phone number ,asked him to call back and make appointment with Dr Amanda Pea, he voiced understanding.

## 2022-05-16 ENCOUNTER — Emergency Department (HOSPITAL_COMMUNITY)
Admission: EM | Admit: 2022-05-16 | Discharge: 2022-05-16 | Disposition: A | Discharge status: Home / Self Care | Payer: BC Managed Care – PPO | Attending: Emergency Medicine | Admitting: Emergency Medicine

## 2022-05-16 ENCOUNTER — Encounter (HOSPITAL_COMMUNITY): Payer: Self-pay

## 2022-05-16 ENCOUNTER — Emergency Department (HOSPITAL_COMMUNITY): Payer: BC Managed Care – PPO

## 2022-05-16 ENCOUNTER — Other Ambulatory Visit: Payer: Self-pay

## 2022-05-16 DIAGNOSIS — M79672 Pain in left foot: Secondary | ICD-10-CM | POA: Diagnosis not present

## 2022-05-16 DIAGNOSIS — I10 Essential (primary) hypertension: Secondary | ICD-10-CM | POA: Insufficient documentation

## 2022-05-16 DIAGNOSIS — Z79899 Other long term (current) drug therapy: Secondary | ICD-10-CM | POA: Diagnosis not present

## 2022-05-16 MED ORDER — KETOROLAC TROMETHAMINE 60 MG/2ML IM SOLN
15.0000 mg | Freq: Once | INTRAMUSCULAR | Status: DC
  Filled 2022-05-16: qty 2

## 2022-05-16 NOTE — Discharge Instructions (Signed)
Exam and imaging are reassuring, I recommend taking ibuprofen 400 mg 3 times daily for next 7 days.  You may apply ice to the area to help with the pain.  Please follow-up with PCP and/or this department for reevaluation if needed.  Come back to the emergency department if you develop chest pain, shortness of breath, severe abdominal pain, uncontrolled nausea, vomiting, diarrhea.

## 2022-05-16 NOTE — ED Triage Notes (Signed)
Reports woke up Tuesday with pain and swelling to L foot.  Denies injury.

## 2022-05-16 NOTE — ED Provider Notes (Signed)
[facility] EMERGENCY DEPARTMENT Provider Note   CSN: [id number] Arrival date & time: 05/16/22  1055     History  Chief Complaint  Patient presents with   Foot Pain    L    George Hamilton is a 51 y.o. male.  HPI  Patient with left significant medical history presents with complaints of left foot pain pain came on suddenly, he woke up Tuesday morning with pain, pain remains at 1 area, does not radiate, is worsened when he applies pressure to the area or when he ambulates, denies any associated trauma, no paresthesia or weakness moving up or down his leg, no calf tenderness, states initially had some swelling but swelling is gone down, no history of gout, does not think he was bit by anything.  Has not had a thing for pain.    Home Medications Prior to Admission medications   Medication Sig Start Date End Date Taking? Authorizing Provider  amLODipine (NORVASC) 10 MG tablet Take 1 tablet (10 mg total) by mouth daily. 05/10/15   Benjiman Core, MD  cyclobenzaprine (FLEXERIL) 10 MG tablet Take 1 tablet (10 mg total) by mouth 2 (two) times daily as needed for muscle spasms. 08/26/15   Vanetta Mulders, MD  HYDROcodone-acetaminophen (NORCO/VICODIN) 5-325 MG tablet Take 1 tablet by mouth every 4 (four) hours as needed. 04/22/18   Burgess Amor, PA-C  lisinopril (PRINIVIL,ZESTRIL) 10 MG tablet Take 1 tablet (10 mg total) by mouth daily. 05/10/15   Benjiman Core, MD  methocarbamol (ROBAXIN) 500 MG tablet Take 2 tablets (1,000 mg total) by mouth 3 (three) times daily. 06/30/15   Ivery Quale, PA-C      Allergies    Patient has no known allergies.    Review of Systems   Review of Systems  Constitutional:  Negative for chills and fever.  Respiratory:  Negative for shortness of breath.   Cardiovascular:  Negative for chest pain.  Gastrointestinal:  Negative for abdominal pain.  Musculoskeletal:        Left foot pain  Neurological:  Negative for headaches.   Physical Exam Updated Vital  Signs BP (!) 142/101   Pulse 79   Temp 98.8 F (37.1 C)   Resp 18   Ht 5\' 10"  (1.778 m)   Wt 102.1 kg   SpO2 98%   BMI 32.28 kg/m  Physical Exam Vitals and nursing note reviewed.  Constitutional:      General: He is not in acute distress.    Appearance: He is not ill-appearing.  HENT:     Head: Normocephalic and atraumatic.     Nose: No congestion.  Eyes:     Conjunctiva/sclera: Conjunctivae normal.  Cardiovascular:     Rate and Rhythm: Normal rate and regular rhythm.     Pulses: Normal pulses.  Pulmonary:     Effort: Pulmonary effort is normal.  Musculoskeletal:     Comments: Full exam of the left leg reveals slight erythema on the anterior proximal aspect of the fourth and fifth metatarsals, slightly warm to the touch, no obvious breakage in skin, area measures about the size of a quarter, no fluctuance induration noted.  He had tenderness focalized at one spot, he has full range of motion in all joints of his toes and ankle, calf is nontender no palpable cords.  He has 2+ dorsal pedal pulses.  Skin:    General: Skin is warm and dry.  Neurological:     Mental Status: He is alert.  Psychiatric:  Mood and Affect: Mood normal.    ED Results / Procedures / Treatments   Labs (all labs ordered are listed, but only abnormal results are displayed) Labs Reviewed - No data to display  EKG None  Radiology DG Foot Complete Left  Result Date: 05/16/2022 CLINICAL DATA:  Lateral forefoot pain.  No known injury. EXAM: LEFT FOOT - COMPLETE 3+ VIEW COMPARISON:  None Available. FINDINGS: There is no evidence of fracture or dislocation. There is no evidence of arthropathy or other focal bone abnormality. Soft tissues are unremarkable. IMPRESSION: Negative. Electronically Signed   By: Duanne Guess D.O.   On: 05/16/2022 12:34    Procedures Procedures    Medications Ordered in ED Medications  ketorolac (TORADOL) injection 15 mg (has no administration in time range)    ED  Course/ Medical Decision Making/ A&P                           Medical Decision Making Amount and/or Complexity of Data Reviewed Radiology: ordered.   This patient presents to the ED for concern of left foot pain, this involves an extensive number of treatment options, and is a complaint that carries with it a high risk of complications and morbidity.  The differential diagnosis includes fracture, dislocation, gout,    Additional history obtained:  Additional history obtained from wife at bedside External records from outside source obtained and reviewed including medical records, orthopedic notes   Co morbidities that complicate the patient evaluation  Hypertension Social Determinants of Health:   N/A   Lab Tests:  I Ordered, and personally interpreted labs.  The pertinent results include: N/A   Imaging Studies ordered:  I ordered imaging studies including x-ray of left foot I independently visualized and interpreted imaging which showed negative for acute findings I agree with the radiologist interpretation   Cardiac Monitoring:  The patient was maintained on a cardiac monitor.  I personally viewed and interpreted the cardiac monitored which showed an underlying rhythm of: N/A   Medicines ordered and prescription drug management:  I ordered medication including Toradol for pain I have reviewed the patients home medicines and have made adjustments as needed  Critical Interventions:  N/A   Reevaluation:  Presents with left foot pain, he has slight erythema and focalized spot tenderness on the fourth and fifth metatarsal I suspect patient was bit by a insect, but cannot rule out fracture at this time will obtain imaging and reassess.  Update on lab and imaging have no complaints he is ready for discharge.    Consultations Obtained:  N/A    Test Considered:  DVT study-defer devices for DVT is very low at this time, presentation very atypical  etiology pain is on the anterior proximal aspect of the metatarsals.  No calf tenderness.    Rule out I have low suspicion for septic arthritis as patient denies IV drug use, skin exam was performed no erythematous joints noted on exam.  I have low suspicion for gout's as presentation is atypical, presents on the medial aspect of the fourth and fifth metatarsals with expected more in the MTP joints.  Low suspicion for fracture or dislocation as x-ray does not feel any significant findings. low suspicion for ligament or tendon damage as area was palpated no gross defects noted, they had full range of motion as well as 5/5 strength.  Low suspicion for compartment syndrome as area was palpated it was soft to the touch, neurovascular  fully intact.  Low suspicion for cellulitis as there is no breakage in skin, very small amount of erythema does not track.   Dispostion and problem list  After consideration of the diagnostic results and the patients response to treatment, I feel that the patent would benefit from discharge.  Foot pain-unclear etiology but I suspect patient was possibly bit by a insect, ice is erythema in the spot, recommend symptom management, follow-up with PCP or ER months time for reevaluation if needed.            Final Clinical Impression(s) / ED Diagnoses Final diagnoses:  Foot pain, left    Rx / DC Orders ED Discharge Orders     None         Barnie DelFaulkner, Mance Vallejo J, PA-C 05/16/22 1343    Bethann BerkshireZammit, Joseph, MD 05/18/22 1702

## 2022-09-16 ENCOUNTER — Other Ambulatory Visit: Payer: Self-pay | Admitting: *Deleted

## 2022-09-16 ENCOUNTER — Other Ambulatory Visit: Payer: Self-pay | Admitting: Urology

## 2022-09-16 DIAGNOSIS — Z125 Encounter for screening for malignant neoplasm of prostate: Secondary | ICD-10-CM

## 2022-09-16 NOTE — Progress Notes (Signed)
Patient: George Hamilton           Date of Birth: May 27, 1971           MRN: [id number] Visit Date: 09/16/2022 PCP: Sharilyn Sites, MD  Prostate Cancer Screening Date of last physical exam:  (None) Date of last rectal exam:  (None) Have you ever had any of the following?: None Have you ever had or been told you have an allergy to latex products?: No Are you currently taking any natural prostate preparations?: No Are you currently experiencing any urinary symptoms?: No  Prostate Exam Exam not completed.  PSA Only completed.  Patient's History There are no problems to display for this patient.  Past Medical History:  Diagnosis Date   Hypertension     Family History  Problem Relation Age of Onset   Hypertension Other    Hypertension Mother     Social History   Occupational History   Not on file  Tobacco Use   Smoking status: Never   Smokeless tobacco: Never  Vaping Use   Vaping Use: Never used  Substance and Sexual Activity   Alcohol use: Yes    Comment: occasional   Drug use: No   Sexual activity: Not on file

## 2022-09-17 LAB — PSA: Prostate Specific Ag, Serum: 0.5 ng/mL

## 2022-09-27 ENCOUNTER — Telehealth: Payer: Self-pay

## 2022-09-27 NOTE — Telephone Encounter (Signed)
Attempted to contact patient regarding lab results. Left message on voicemail requesting a return call.  

## 2022-10-09 ENCOUNTER — Emergency Department (HOSPITAL_COMMUNITY)
Admission: EM | Admit: 2022-10-09 | Discharge: 2022-10-09 | Disposition: A | Discharge status: Home / Self Care | Payer: BC Managed Care – PPO | Attending: Emergency Medicine | Admitting: Emergency Medicine

## 2022-10-09 ENCOUNTER — Encounter (HOSPITAL_COMMUNITY): Payer: Self-pay | Admitting: Emergency Medicine

## 2022-10-09 ENCOUNTER — Other Ambulatory Visit: Payer: Self-pay

## 2022-10-09 DIAGNOSIS — M542 Cervicalgia: Secondary | ICD-10-CM | POA: Diagnosis present

## 2022-10-09 DIAGNOSIS — I10 Essential (primary) hypertension: Secondary | ICD-10-CM | POA: Insufficient documentation

## 2022-10-09 MED ORDER — METHOCARBAMOL 500 MG PO TABS: 500.0000 mg | ORAL_TABLET | Freq: Two times a day (BID) | ORAL | 0 refills | Status: AC

## 2022-10-09 MED ORDER — ACETAMINOPHEN 500 MG PO TABS
1000.0000 mg | ORAL_TABLET | Freq: Once | ORAL | Status: AC
  Administered 2022-10-09: 1000 mg via ORAL
  Filled 2022-10-09: qty 2

## 2022-10-09 MED ORDER — METHOCARBAMOL 500 MG PO TABS
1000.0000 mg | ORAL_TABLET | Freq: Once | ORAL | Status: AC
  Administered 2022-10-09: 1000 mg via ORAL
  Filled 2022-10-09: qty 2

## 2022-10-09 MED ORDER — METHOCARBAMOL 500 MG PO TABS: 500.0000 mg | ORAL_TABLET | Freq: Two times a day (BID) | ORAL | 0 refills | Status: DC

## 2022-10-09 MED ORDER — AMLODIPINE BESYLATE 5 MG PO TABS
10.0000 mg | ORAL_TABLET | Freq: Once | ORAL | Status: AC
  Administered 2022-10-09: 10 mg via ORAL
  Filled 2022-10-09: qty 2

## 2022-10-09 MED ORDER — KETOROLAC TROMETHAMINE 15 MG/ML IJ SOLN
15.0000 mg | Freq: Once | INTRAMUSCULAR | Status: AC
  Administered 2022-10-09: 15 mg via INTRAMUSCULAR
  Filled 2022-10-09: qty 1

## 2022-10-09 MED ORDER — LISINOPRIL 10 MG PO TABS
10.0000 mg | ORAL_TABLET | Freq: Every day | ORAL | Status: DC
  Administered 2022-10-09: 10 mg via ORAL
  Filled 2022-10-09: qty 1

## 2022-10-09 NOTE — Discharge Instructions (Addendum)
You were seen today for neck pain. We did not identify any emergent cause for your symptoms. Your evaluation is most consistent with muscle strain.   Plan and next steps:   The following may be helpful in managing your symptoms:   Pain- Lidocaine Patches  Apply to affected area for up to 12 hours at a time.   Pain/Fever- Adult Tylenol dosing:  650 mg orally every 4 to 6 hours as needed, MAX: 3250 mg/24 hours   (Extra-strength) 1000 mg orally every 6 hours as needed; MAX: 3000 mg/24 hours   Do not use if you have liver disease. Read the label on the bottle.   Pain/Fever- Adult Ibuprofen Dosing  200 to 400 mg orally every 4 to 6 hours as needed; MAX 1200 mg/day; do not take longer than 10 days   Do not use if you have kidney disease. Read the label on the bottle   Muscle relaxer: You have been prescribed Robaxin.  Take 500 mg to 1000 mg 1-2 times per day.  Findings:  You may see all of your lab and imaging results utilizing our online portal! Look in this document or ask a team member for your mychart* access information. The most notable results have additionally been verbally communicated with you and your bedside family.    Follow-up Plan:   Follow up with the patient's normal primary care provider for monitoring of this condition within 48 hours.   Signs/Symptoms that would warrant return to the ED:  Please return to the ED if you experience worsening of symptoms or any abrupt changes in your health. Standard of care precautions for your chief complaint have already been verbally communicated with you. Always be on alert for fevers, chills, shortness of breath, chest pains, or sudden changes that warrant immediate evaluation.    Thank you for allowing [place] to be a part of you and your families' care.   Tretha Sciara MD

## 2022-10-09 NOTE — ED Provider Notes (Signed)
[facility] EMERGENCY DEPARTMENT Provider Note   CSN: [id number] Arrival date & time: 10/09/22  4696     History Chief Complaint  Patient presents with   Torticollis    HPI George Hamilton is a 51 y.o. male presenting for neck pain.  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  He states he had an episode like this in the past.  He is having right-sided supraclavicular to supra scapular pain.  Worse when he lifts his arm over his head.  He denies any neurologic symptoms.  He missed his blood pressure medications this morning.  He has a history of hypertension otherwise healthy.  He denies fevers or chills, nausea vomiting, single shortness of breath.  He is otherwise ambulatory tolerating p.o. intake at this time.  No medications prior to arrival.   Patient's recorded medical, surgical, social, medication list and allergies were reviewed in the Snapshot window as part of the initial history.   Review of Systems   Review of Systems  Constitutional:  Negative for chills and fever.  HENT:  Negative for ear pain and sore throat.   Eyes:  Negative for pain and visual disturbance.  Respiratory:  Negative for cough and shortness of breath.   Cardiovascular:  Negative for chest pain and palpitations.  Gastrointestinal:  Negative for abdominal pain and vomiting.  Genitourinary:  Negative for dysuria and hematuria.  Musculoskeletal:  Positive for neck pain. Negative for arthralgias and back pain.  Skin:  Negative for color change and rash.  Neurological:  Negative for seizures and syncope.  All other systems reviewed and are negative.   Physical Exam Updated Vital Signs BP (!) 215/117 (BP Location: Left Arm)   Pulse 85   Temp 98.5 F (36.9 C) (Oral)   Resp 19   SpO2 100%  Physical Exam Vitals and nursing note reviewed.  Constitutional:      General: He is not in acute distress.    Appearance: He is well-developed.  HENT:     Head: Normocephalic and  atraumatic.  Eyes:     Conjunctiva/sclera: Conjunctivae normal.  Cardiovascular:     Rate and Rhythm: Normal rate and regular rhythm.     Heart sounds: No murmur heard. Pulmonary:     Effort: Pulmonary effort is normal. No respiratory distress.     Breath sounds: Normal breath sounds.  Abdominal:     Palpations: Abdomen is soft.     Tenderness: There is no abdominal tenderness.  Musculoskeletal:        General: Tenderness (Supraspinatus muscle tenderness) present. No swelling.     Cervical back: Neck supple. No tenderness.  Skin:    General: Skin is warm and dry.     Capillary Refill: Capillary refill takes less than 2 seconds.  Neurological:     Mental Status: He is alert.  Psychiatric:        Mood and Affect: Mood normal.      ED Course/ Medical Decision Making/ A&P    Procedures Procedures   Medications Ordered in ED Medications  amLODipine (NORVASC) tablet 10 mg (has no administration in time range)  lisinopril (ZESTRIL) tablet 10 mg (has no administration in time range)  ketorolac (TORADOL) 15 MG/ML injection 15 mg (has no administration in time range)  acetaminophen (TYLENOL) tablet 1,000 mg (has no administration in time range)  methocarbamol (ROBAXIN) tablet 1,000 mg (has no administration in time range)    Medical Decision Making:    George Hamilton  is a 51 y.o. male who presented to the ED today with neck pain detailed above.     Patient's presentation is complicated by their history of hypertension on multiple outpatient medications.  Patient placed on continuous vitals and telemetry monitoring while in ED which was reviewed periodically.   Complete initial physical exam performed, notably the patient  was hypertensive but otherwise hemodynamically stable in no acute distress.  No neurologic abnormalities appreciated on exam.  Significant pain on elevation of the right arm localizing to his supraspinatus muscle.      Reviewed and confirmed nursing documentation  for past medical history, family history, social history.    Initial Assessment:   With the patient's presentation of neck pain, most likely diagnosis is skeletal strain. Other diagnoses were considered including (but not limited to) epidural abscess, meningitis, fracture, spinal stenosis, brachial plexus injury. These are considered less likely due to history of present illness and physical exam findings.   This is most consistent with an acute life/limb threatening illness complicated by underlying chronic conditions. In particular, infection seems less likely based on lack of fever, lack of risk factors including IV drug use. Spinal stenosis and brachial plexus injury seem less likely based on the mechanism and lack of neurologic abnormality at this time. Fracture seems less likely based on lack of trauma or falls. Initial Plan:  Discussed radiographic evaluation, however given findings localizing to muscle bellies, favor that patient would likely benefit from anti-inflammatory administration, muscle relaxer medications and close follow-up in the outpatient setting with primary care provider for likely musculoskeletal etiology.  Recommend they follow-up closely within 48 hours for ongoing care and management to ensure ongoing symptomatic improvement.  We will trial outpatient NSAIDs, Tylenol, muscle relaxer with strict return precautions reinforced. Additionally, patient does have significant hypertension today, however he has not been taking his blood pressure medication.  We will administer home doses today.  He has a history of similarly elevated blood pressures in the past without taking his blood pressure medication. Favor continued hypertensive urgency without evidence of emergency based on history with no change in urination no headache no visual changes.  Patient will follow-up with PCP regarding his ongoing hypertension.  Clinical Impression:  1. Neck pain      Discharge   Final  Clinical Impression(s) / ED Diagnoses Final diagnoses:  Neck pain    Rx / DC Orders ED Discharge Orders          Ordered    methocarbamol (ROBAXIN) 500 MG tablet  2 times daily        10/09/22 7672              Tretha Sciara, MD 10/09/22 [phone]

## 2022-10-09 NOTE — ED Notes (Signed)
Paper prescription given to pt for medications.

## 2022-10-09 NOTE — ED Triage Notes (Addendum)
Pt c/o right side neck pain radiating to right shoulder intermittent x 2 days. Pain worse with movement. Nad.  BP high, pt has not taken his bp meds today

## 2022-10-23 ENCOUNTER — Telehealth: Payer: Self-pay

## 2022-10-23 NOTE — Telephone Encounter (Signed)
Normal PSA letter mailed.   October 23, 2022         Dear Mr. George Hamilton,    Thank you for your participation in the Prostate Cancer Screening at [facility] on September 16, 2022.   The result of your blood test for the level of the Prostate Specific Antigen (PSA) was found to be normal. It is recommended that continue yearly screening and share these results with your physician.  If you have any questions about these results, please call Fonnie Mu at [phone] or Aizah Gehlhausen at [phone].  Sincerely,     Fonnie Mu, MSN, RN,OCN Oncology Outreach Manager [facility]   Clois Dupes, LPN Oncology Outreach  [facility]

## 2023-01-27 ENCOUNTER — Emergency Department (HOSPITAL_COMMUNITY): Payer: BC Managed Care – PPO

## 2023-01-27 ENCOUNTER — Emergency Department (HOSPITAL_COMMUNITY)
Admission: EM | Admit: 2023-01-27 | Discharge: 2023-01-27 | Disposition: A | Discharge status: Home / Self Care | Payer: BC Managed Care – PPO | Attending: Emergency Medicine | Admitting: Emergency Medicine

## 2023-01-27 ENCOUNTER — Other Ambulatory Visit: Payer: Self-pay

## 2023-01-27 ENCOUNTER — Encounter (HOSPITAL_COMMUNITY): Payer: Self-pay | Admitting: Emergency Medicine

## 2023-01-27 DIAGNOSIS — R2241 Localized swelling, mass and lump, right lower limb: Secondary | ICD-10-CM | POA: Insufficient documentation

## 2023-01-27 DIAGNOSIS — Z79899 Other long term (current) drug therapy: Secondary | ICD-10-CM | POA: Diagnosis not present

## 2023-01-27 DIAGNOSIS — M25561 Pain in right knee: Secondary | ICD-10-CM | POA: Diagnosis present

## 2023-01-27 MED ORDER — NAPROXEN 250 MG PO TABS
500.0000 mg | ORAL_TABLET | Freq: Once | ORAL | Status: AC
  Administered 2023-01-27: 500 mg via ORAL
  Filled 2023-01-27: qty 2

## 2023-01-27 NOTE — Discharge Instructions (Signed)
Please take your blood pressure medications first thing when you are discharged as your blood pressure is very elevated today and this can lead to life-threatening side effects.  These pick up naproxen over-the-counter and used as prescribed on the bottle.  Please ice your knee for 15 minutes 3-4 times a day.  Please use knee brace and elevate your knee.  I will prescribe for you a work note.  Please make an appointment with her primary care to be seen in the next few days regarding recent ER visit.  If symptoms worsen please return to ER.

## 2023-01-27 NOTE — ED Triage Notes (Signed)
Pt c/o right knee pain with swelling after hitting his knee on coffee table x 3 days ago, pt reports hx of knee surgery to same knee, pt BP in triage 184/128, pt reports hx and states he did not take his BP meds today

## 2023-01-27 NOTE — ED Provider Notes (Signed)
[facility] Provider Note   CSN: [id number] Arrival date & time: 01/27/23  1014     History  Chief Complaint  Patient presents with   Knee Pain    Maliek Gruenwald is a 52 y.o. male history of right patellar tendon repair presented with 3 days of right knee swelling and pain.  Patient stated he had his knee on a coffee table.  Patient stated repair had been years ago.  Patient has not tried ibuprofen/Tylenol, knee brace, ice.  Patient stated he has been trying heat pads.  Patient states it hurts to bear weight but he is able to bear weight and ambulate.    Patient denied any fevers, chest pain, shortness of breath, changes in sensation/motor skills, overlying skin color changes   Home Medications Prior to Admission medications   Medication Sig Start Date End Date Taking? Authorizing Provider  amLODipine (NORVASC) 10 MG tablet Take 1 tablet (10 mg total) by mouth daily. 05/10/15  Yes Davonna Belling, MD  telmisartan (MICARDIS) 40 MG tablet Take 40 mg by mouth daily. 01/16/23  Yes [provider]  cyclobenzaprine (FLEXERIL) 10 MG tablet Take 1 tablet (10 mg total) by mouth 2 (two) times daily as needed for muscle spasms. Patient not taking: Reported on 01/27/2023 08/26/15   Fredia Sorrow, MD  HYDROcodone-acetaminophen (NORCO/VICODIN) 5-325 MG tablet Take 1 tablet by mouth every 4 (four) hours as needed. Patient not taking: Reported on 01/27/2023 04/22/18   Evalee Jefferson, PA-C  lisinopril (PRINIVIL,ZESTRIL) 10 MG tablet Take 1 tablet (10 mg total) by mouth daily. Patient not taking: Reported on 01/27/2023 05/10/15   Davonna Belling, MD      Allergies    Patient has no known allergies.    Review of Systems   Review of Systems See HPI Physical Exam Updated Vital Signs BP (!) 194/121 (BP Location: Right Wrist)   Pulse 85   Temp 98.9 F (37.2 C) (Oral)   Resp 18   Ht 5' 11"$  (1.803 m)   Wt 104.3 kg   SpO2 100%   BMI 32.08 kg/m   Physical Exam Vitals and nursing note reviewed.  Constitutional:      General: He is not in acute distress.    Appearance: He is well-developed.  HENT:     Head: Normocephalic and atraumatic.  Eyes:     Conjunctiva/sclera: Conjunctivae normal.  Cardiovascular:     Comments: 2+ bilateral dorsalis pedal and posterior tibialis pulses with regular rate Pulmonary:     Effort: Pulmonary effort is normal. No respiratory distress.  Musculoskeletal:        General: Swelling (Right knee) and tenderness (Tender on the medial aspect of the right knee where he hit his knee on the cough table) present.     Cervical back: Normal range of motion.     Comments: 4-5 right knee extension as compared to 5 out of 5 left knee extension 5/5 bilateral dorsiflexion/plantarflexion, toe flexion/extension  Skin:    General: Skin is warm and dry.     Capillary Refill: Capillary refill takes less than 2 seconds.     Comments: No overlying skin color changes on right knee  Neurological:     General: No focal deficit present.     Mental Status: He is alert and oriented to person, place, and time.     Comments: Sensation intact distally in both feet  Psychiatric:        Mood and Affect: Mood normal.  ED Results / Procedures / Treatments   Labs (all labs ordered are listed, but only abnormal results are displayed) Labs Reviewed - No data to display  EKG None  Radiology DG Knee Complete 4 Views Right  Result Date: 01/27/2023 CLINICAL DATA:  Right knee pain after injury 3 days ago. EXAM: RIGHT KNEE - COMPLETE 4+ VIEW COMPARISON:  None Available. FINDINGS: No fracture or dislocation is noted. Small suprapatellar joint effusion is noted. Mild narrowing of medial joint space is noted with osteophyte formation. Chondrocalcinosis is noted in lateral joint space. IMPRESSION: Small suprapatellar joint effusion. Mild degenerative joint disease is noted as described above. No fracture or dislocation.  Electronically Signed   By: Marijo Conception M.D.   On: 01/27/2023 10:44    Procedures Procedures    Medications Ordered in ED Medications - No data to display  ED Course/ Medical Decision Making/ A&P                             Medical Decision Making Amount and/or Complexity of Data Reviewed Radiology: ordered.   Bravlio Morin 52 y.o. presented today for right knee pain. Working DDx that I considered at this time includes, but not limited to, septic arthritis, cellulitis, patellar fracture, knee dislocation, ecchymosis.  Review of prior external notes: None  Unique Tests and My Interpretation:  Right knee x-ray: No acute osseous changes however there is moderate effusion  Discussion with Independent Historian: Wife  Discussion of Management of Tests: None  Risk: Low:  - based on diagnostic testing/clinical impression and treatment plan   Risk Stratification Score: None  R/o DDx: Septic arthritis: Patient does not have any overlying skin color changes and does not endorse exquisite pain and is able to range his knee albeit with pain, patient denied any fevers Cellulitis: No overlying skin color changes Patellar fracture: X-rays negative Knee dislocation: X-rays negative  Plan: Patient came in with right knee pain after bumping his knee on a coffee table 3 days ago.  Patient does not appear to be in any distress and I did not note any signs of septic arthritis or ischemic limb on exam.  Patient asked for pain medication and is able to take ibuprofen so given 500 mg naproxen for pain management.  Patient stated naproxen helped.  I educated the patient on taking naproxen, icing his knee 15 minutes 3-4 times a day, knee brace, elevation.  I discouraged patient from using a heating pad on his knee.  Patient is able to ambulate however it is painful for him.  Patient's blood pressure is 194/121 today however is most likely due to him not taking his blood pressure medications this  morning as opposed to any acute pathology.  I educated the patient on taking his blood pressure medications first thing when he gets back home.  Patient stated this is what his blood pressure normally runs at and I spoke with the patient at length about the importance of following up with his primary care provider regarding blood pressure management as there are long-term consequences with having blood pressure the side.  Patient verbally agreed to take his blood pressure medication at home when discharged.  Patient denied headache, neck pain, blurry vision/vision changes, chest pain, shortness of breath, changes in sensation/motor skills and I believe is stable for discharge even with his hypertension.          Final Clinical Impression(s) / ED Diagnoses Final diagnoses:  None    Rx / DC Orders ED Discharge Orders     None         Elvina Sidle 01/27/23 1219    Fransico Meadow, MD 02/02/23 0830

## 2023-04-11 IMAGING — DX DG FOOT COMPLETE 3+V*L*
3 series · 3 of 3 positions shown · non-contrast
Comparison: None Available.

CLINICAL DATA: Lateral forefoot pain.  No known injury.

EXAM:
LEFT FOOT - COMPLETE 3+ VIEW

[foot ap]
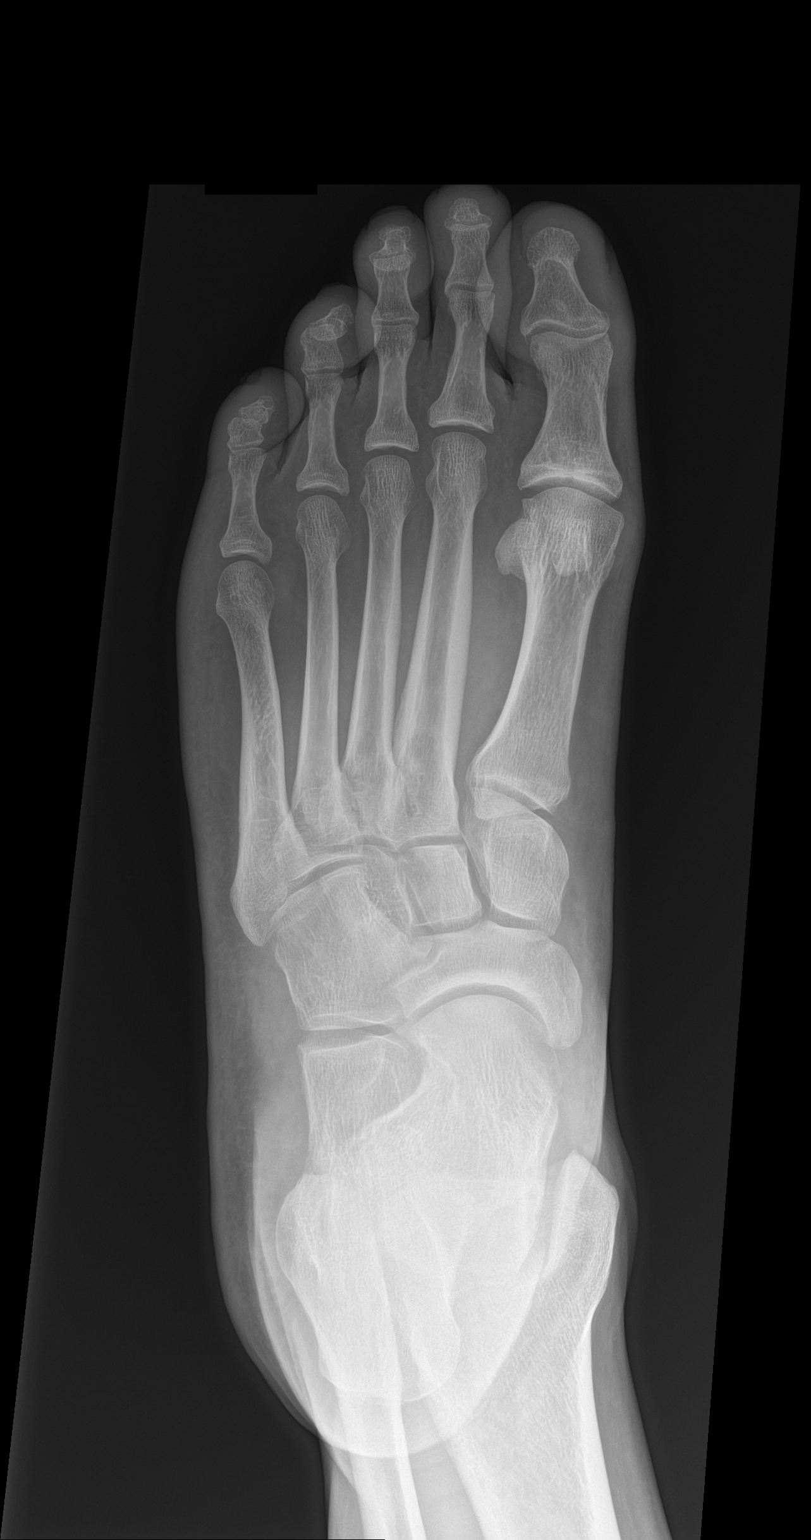

[foot obl]
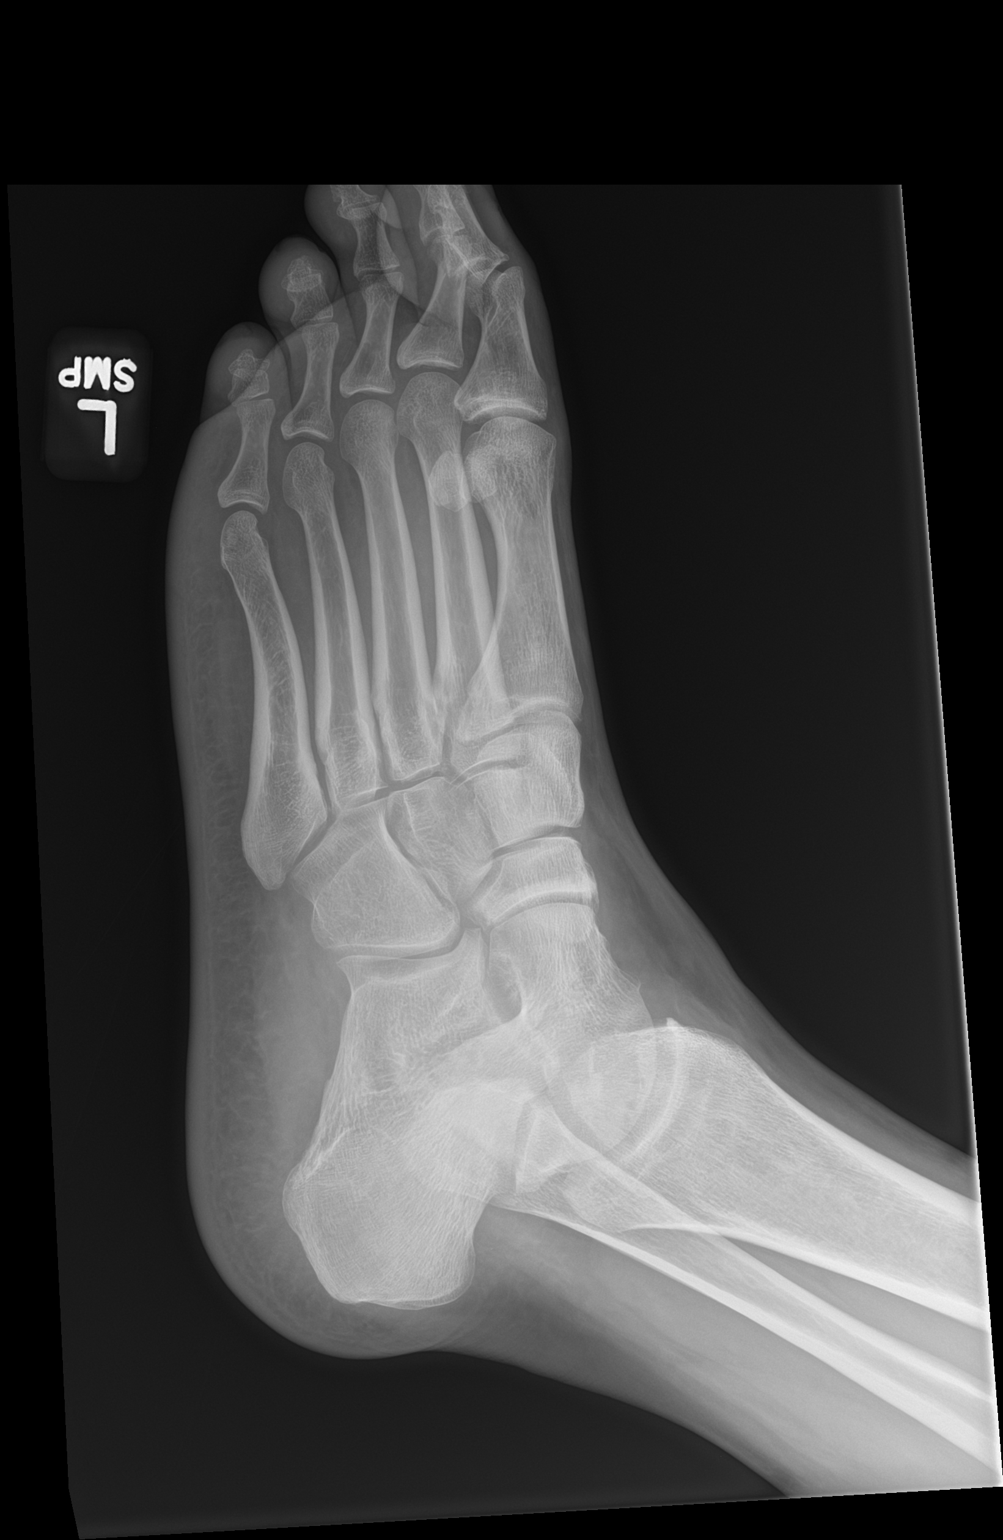

[foot lat]
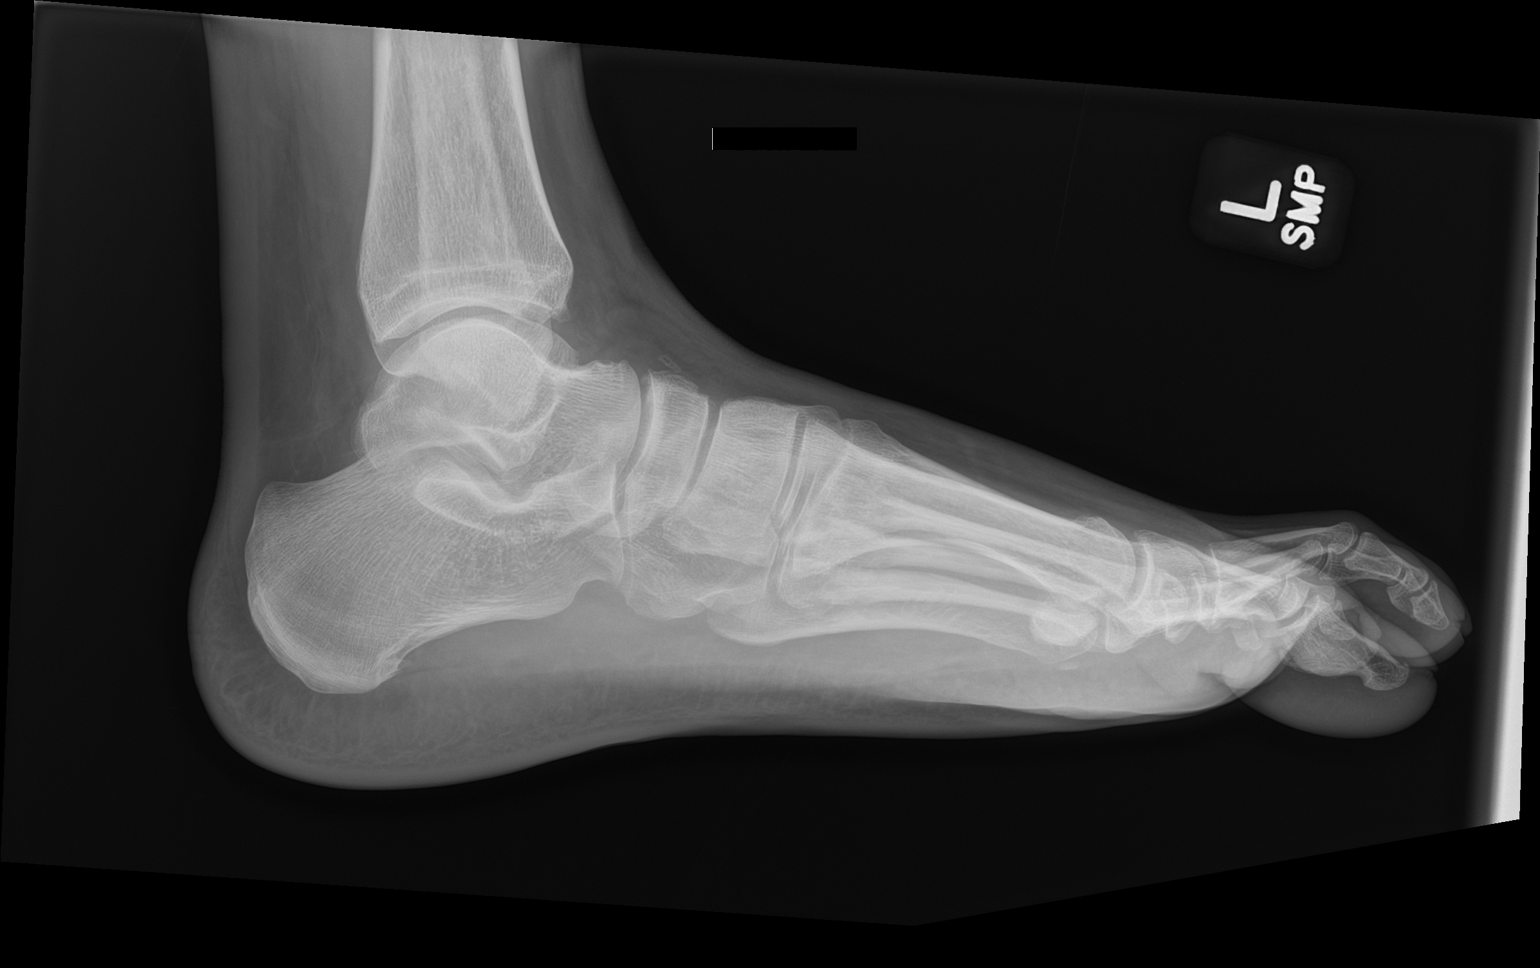

[3 of 3 positions shown; findings below may reference images not displayed]

FINDINGS: There is no evidence of fracture or dislocation. There is no
evidence of arthropathy or other focal bone abnormality. Soft
tissues are unremarkable.
IMPRESSION: Negative.
